# Patient Record
Sex: Female | Born: 1970 | Race: White | Hispanic: No | Marital: Single | Smoking: Current every day smoker
Health system: Southern US, Community
[De-identification: ages and names within clinical notes are randomized; demographics above are authoritative.]

## PROBLEM LIST (undated history)

## (undated) DIAGNOSIS — F419 Anxiety disorder, unspecified: Secondary | ICD-10-CM

## (undated) DIAGNOSIS — F319 Bipolar disorder, unspecified: Secondary | ICD-10-CM

## (undated) DIAGNOSIS — M1990 Unspecified osteoarthritis, unspecified site: Secondary | ICD-10-CM

## (undated) DIAGNOSIS — F1210 Cannabis abuse, uncomplicated: Secondary | ICD-10-CM

## (undated) DIAGNOSIS — Z87442 Personal history of urinary calculi: Secondary | ICD-10-CM

## (undated) DIAGNOSIS — F1010 Alcohol abuse, uncomplicated: Secondary | ICD-10-CM

## (undated) HISTORY — PX: TUBAL LIGATION: SHX77

---

## 2005-12-18 ENCOUNTER — Emergency Department (HOSPITAL_COMMUNITY): Admission: EM | Admit: 2005-12-18 | Discharge: 2005-12-19 | Payer: Self-pay | Admitting: Emergency Medicine

## 2016-12-11 ENCOUNTER — Ambulatory Visit (INDEPENDENT_AMBULATORY_CARE_PROVIDER_SITE_OTHER): Payer: Self-pay | Admitting: Family Medicine

## 2016-12-11 ENCOUNTER — Encounter: Payer: Self-pay | Admitting: Family Medicine

## 2016-12-11 DIAGNOSIS — M25562 Pain in left knee: Secondary | ICD-10-CM

## 2016-12-11 MED ORDER — METHYLPREDNISOLONE ACETATE 40 MG/ML IJ SUSP
40.0000 mg | Freq: Once | INTRAMUSCULAR | Status: AC
  Administered 2016-12-11: 40 mg via INTRA_ARTICULAR

## 2016-12-11 NOTE — Patient Instructions (Signed)
Your knee pain and swelling are due to arthritis, less likely a meniscus tear. These are the different medications you can use for this: Tylenol 500mg  1-2 tabs three times a day for pain. Aleve 1-2 tabs twice a day with food Capsaicin, aspercreme, or biofreeze topically up to four times a day may also help with pain. Cortisone injections are an option - you were given this today. If cortisone injections do not help, there are different types of shots that may help but they take longer to take effect. It's important that you continue to stay active. Straight leg raises, knee extensions 3 sets of 10 once a day (add ankle weight if these become too easy). Consider physical therapy to strengthen muscles around the joint that hurts to take pressure off of the joint itself. Shoe inserts with good arch support may be helpful. Ice 15 minutes at a time 3-4 times a day as needed to help with pain. Get the [facility] coverage. If not improving as expected over the next 1-2 weeks (and you have the cone coverage) call me and would go ahead with MRI.

## 2016-12-15 DIAGNOSIS — M25562 Pain in left knee: Secondary | ICD-10-CM | POA: Insufficient documentation

## 2016-12-15 NOTE — Progress Notes (Addendum)
PCP: [name] Patient  Subjective:   HPI: Patient is a 46 y.o. female here for left knee pain, swelling.  Patient reports she's had about 1 month of left knee pain and swelling. Started when she was going up and down a ladder, hanging sheet rock. No acute injury or trauma when this happened. Pain worse with walking, standing. Difficulty getting comfortable. Pain radiates from anterior to posterior knee. Feels medially and laterally, sharp, 10/10. Had radiographs and [facility] though unable to pull these up - told these looked ok. Taking ibuprofen 800mg  and elevating. No skin changes, numbness.  No past medical history on file.  No current outpatient prescriptions on file prior to visit.   No current facility-administered medications on file prior to visit.     No past surgical history on file.  No Known Allergies  Social History   Social History  . Marital status: Single    Spouse name: N/A  . Number of children: N/A  . Years of education: N/A   Occupational History  . Not on file.   Social History Main Topics  . Smoking status: Current Every Day Smoker  . Smokeless tobacco: Never Used  . Alcohol use Not on file  . Drug use: Unknown  . Sexual activity: Not on file   Other Topics Concern  . Not on file   Social History Narrative  . No narrative on file    No family history on file.  BP (!) 143/92   Pulse 69   Ht 5\' 4"  (1.626 m)   Wt 200 lb (90.7 kg)   BMI 34.33 kg/m   Review of Systems: See HPI above.     Objective:  Physical Exam:  Gen: NAD, comfortable in exam room  Left knee: Mild effusion.  No bruising, other deformity. TTP diffusely anteriorly. ROM 0 - 100 degrees. Negative ant/post drawers. Negative valgus/varus testing. Negative lachmanns. Pain with mcmurrays, apleys.  Negative patellar apprehension. NV intact distally.  Right knee: FROM without pain.   Assessment & Plan:  1. Left knee pain - consistent with flare of DJD,  less likely meniscus tear.  Discussed tylenol, aleve, topical medications.  Aspiration/injection given today.  Fluid clear, consistent with above diagnoses.  Home exercises reviewed.  Icing.  Paperwork given for cone coverage.  Consider MRI if not improving as expected.  After informed written consent patient was lying supine on exam table.  Left knee was prepped with alcohol swab.  Utilizing superolateral approach, 3 mL of bupivicaine was used for local anesthesia.  Then using an 18g needle on 60cc syringe, 9 mL of clear straw-colored fluid was aspirated from left knee.  Knee was then injected with 3:1 bupivicaine:depomedrol.  Patient tolerated procedure well without immediate complications.  Addendum:  MRI reviewed and discussed with patient.  She does have a medial meniscus tear with extrusion but also severe cartilage loss medially with bony edema.  Concerning she did not improve with aspiration and injection.  I doubt she will improve without surgical intervention - ? If arthroscopy is an option given underlying severe articular cartilage loss.  Will refer to ortho for further intervention.

## 2016-12-15 NOTE — Assessment & Plan Note (Signed)
consistent with flare of DJD, less likely meniscus tear.  Discussed tylenol, aleve, topical medications.  Aspiration/injection given today.  Fluid clear, consistent with above diagnoses.  Home exercises reviewed.  Icing.  Paperwork given for cone coverage.  Consider MRI if not improving as expected.  After informed written consent patient was lying supine on exam table.  Left knee was prepped with alcohol swab.  Utilizing superolateral approach, 3 mL of bupivicaine was used for local anesthesia.  Then using an 18g needle on 60cc syringe, 9 mL of clear straw-colored fluid was aspirated from left knee.  Knee was then injected with 3:1 bupivicaine:depomedrol.  Patient tolerated procedure well without immediate complications

## 2017-01-01 ENCOUNTER — Ambulatory Visit: Payer: Self-pay

## 2017-01-07 ENCOUNTER — Encounter (HOSPITAL_BASED_OUTPATIENT_CLINIC_OR_DEPARTMENT_OTHER): Payer: Self-pay

## 2017-01-07 ENCOUNTER — Emergency Department (HOSPITAL_BASED_OUTPATIENT_CLINIC_OR_DEPARTMENT_OTHER)
Admission: EM | Admit: 2017-01-07 | Discharge: 2017-01-07 | Disposition: A | Discharge status: Home / Self Care | Payer: Self-pay | Attending: Emergency Medicine | Admitting: Emergency Medicine

## 2017-01-07 ENCOUNTER — Emergency Department (HOSPITAL_BASED_OUTPATIENT_CLINIC_OR_DEPARTMENT_OTHER): Payer: Self-pay

## 2017-01-07 DIAGNOSIS — G8929 Other chronic pain: Secondary | ICD-10-CM

## 2017-01-07 DIAGNOSIS — F17200 Nicotine dependence, unspecified, uncomplicated: Secondary | ICD-10-CM | POA: Insufficient documentation

## 2017-01-07 DIAGNOSIS — M25562 Pain in left knee: Secondary | ICD-10-CM | POA: Insufficient documentation

## 2017-01-07 HISTORY — DX: Cannabis abuse, uncomplicated: F12.10

## 2017-01-07 HISTORY — DX: Anxiety disorder, unspecified: F41.9

## 2017-01-07 HISTORY — DX: Alcohol abuse, uncomplicated: F10.10

## 2017-01-07 MED ORDER — NAPROXEN 375 MG PO TABS: 375.0000 mg | ORAL_TABLET | Freq: Two times a day (BID) | ORAL | 0 refills | Status: DC

## 2017-01-07 MED ORDER — ACETAMINOPHEN 500 MG PO TABS: 500.0000 mg | ORAL_TABLET | Freq: Four times a day (QID) | ORAL | 0 refills | Status: DC | PRN

## 2017-01-07 NOTE — Discharge Instructions (Signed)
Medications: Naprosyn, Tylenol  Treatment: Take Naprosyn twice daily. Take Tylenol every 6 hours as needed for your pain in addition to Naprosyn. Use ice 3-4 times daily alternating 20 minutes on, 20 minutes off. Elevate your leg whenever you're not walking on it. Use brace to help support your knee with walking.  Follow-up: Please follow-up with Dr. Pearletha ForgeHudnall for further evaluation and treatment of your knee pain and possible MRI and she suggested before. Please return to the emergency department if you develop any fevers, pain with even small movements of your knee, excessive redness or warmth, or any other concerning symptoms.

## 2017-01-07 NOTE — ED Notes (Signed)
ED Provider at bedside. 

## 2017-01-07 NOTE — ED Provider Notes (Signed)
[facility] Provider Note   CSN: [id number] Arrival date & time: 01/07/17  1856   By signing my name below, I, Clovis Pu, attest that this documentation has been prepared under the direction and in the presence of   Gurveer Colucci,PA-C. Electronically Signed: Clovis Pu, ED Scribe. 01/07/17. 8:49 PM.   History   Chief Complaint Chief Complaint  Patient presents with  . Knee Pain    HPI Comments:  Sarah Hanson is a 46 y.o. female who presents to the Emergency Department complaining of continued left knee pain x 2-3 months. Her pain is worse with ambulation. She also reports joint swelling. Pt was seen by Dr. Norton Blizzard on 12/11/16 and had fluid aspirated as well as cortisone injection with mild relief. She has been using a knee brace, cane, and ace wrap with mild relief. She has also taken ibuprofen, applied ice and elevated her leg with mild relief. Pt denies fevers, numbness/tingling, abdominal pain or any other associated symptoms. No other complaints noted.     The history is provided by the patient. No language interpreter was used.    Past Medical History:  Diagnosis Date  . Anxiety   . ETOH abuse   . Marijuana abuse     Patient Active Problem List   Diagnosis Date Noted  . Left knee pain 12/15/2016    Past Surgical History:  Procedure Laterality Date  . CESAREAN SECTION      OB History    No data available       Home Medications    Prior to Admission medications   Medication Sig Start Date End Date Taking? Authorizing Provider  Citalopram Hydrobromide (CELEXA PO) Take by mouth.   Yes Historical Provider, MD  acetaminophen (TYLENOL) 500 MG tablet Take 1 tablet (500 mg total) by mouth every 6 (six) hours as needed. 01/07/17   Emi Holes, PA-C  naproxen (NAPROSYN) 375 MG tablet Take 1 tablet (375 mg total) by mouth 2 (two) times daily. 01/07/17   Emi Holes, PA-C    Family History No family history on file.  Social  History Social History  Substance Use Topics  . Smoking status: Current Every Day Smoker  . Smokeless tobacco: Never Used  . Alcohol use Not on file     Comment: in rehab     Allergies   Patient has no known allergies.   Review of Systems Review of Systems  Constitutional: Negative for fever.  Gastrointestinal: Negative for abdominal pain.  Musculoskeletal: Positive for joint swelling and myalgias.  Neurological: Negative for numbness.   Physical Exam Updated Vital Signs BP (!) 162/85 (BP Location: Left Arm)   Pulse 77   Temp 97.6 F (36.4 C) (Oral)   Resp 16   Ht 5\' 4"  (1.626 m)   Wt 100.2 kg   LMP 01/06/2017   SpO2 99%   BMI 37.93 kg/m   Physical Exam  Constitutional: She appears well-developed and well-nourished. No distress.  HENT:  Head: Normocephalic and atraumatic.  Mouth/Throat: Oropharynx is clear and moist. No oropharyngeal exudate.  Eyes: Conjunctivae are normal. Pupils are equal, round, and reactive to light. Right eye exhibits no discharge. Left eye exhibits no discharge. No scleral icterus.  Neck: Normal range of motion. Neck supple. No thyromegaly present.  Cardiovascular: Normal rate, regular rhythm, normal heart sounds and intact distal pulses.  Exam reveals no gallop and no friction rub.   No murmur heard. Pulmonary/Chest: Effort normal and breath sounds normal. No  stridor. No respiratory distress. She has no wheezes. She has no rales.  Abdominal: Soft. Bowel sounds are normal. She exhibits no distension. There is no tenderness. There is no rebound and no guarding.  Musculoskeletal: Normal range of motion. She exhibits edema and tenderness.  Medial joint line tenderness to left knee. Pain with McMurray test with internal rotation. No pain or laxity with varus and valgus stress. Negative anterior and posterior drawer. Mild to moderate edema. No erythema or warmth. Full passive ROM without pain. Normal sensation to extremity.  Lymphadenopathy:    She  has no cervical adenopathy.  Neurological: She is alert. Coordination normal.  Skin: Skin is warm and dry. No rash noted. She is not diaphoretic. No erythema. No pallor.  Psychiatric: She has a normal mood and affect.  Nursing note and vitals reviewed.    ED Treatments / Results  DIAGNOSTIC STUDIES:  Oxygen Saturation is 100% on RA, normal by my interpretation.    COORDINATION OF CARE:  8:45 PM Discussed treatment plan with pt at bedside and pt agreed to plan.  Labs (all labs ordered are listed, but only abnormal results are displayed) Labs Reviewed - No data to display  EKG  EKG Interpretation None       Radiology Dg Knee Complete 4 Views Left  Result Date: 01/07/2017 CLINICAL DATA:  Left knee pain and swelling for several months EXAM: LEFT KNEE - COMPLETE 4+ VIEW COMPARISON:  None. FINDINGS: Four views of the left knee submitted. No acute fracture or subluxation. There is narrowing of medial joint compartment. Mild spurring of lateral tibial plateau. No radiopaque foreign body. No joint effusion. IMPRESSION: No acute fracture or subluxation. Narrowing of medial joint compartment. Mild spurring of lateral tibial plateau. Electronically Signed   By: Natasha MeadLiviu  Pop M.D.   On: 01/07/2017 19:30    Procedures Procedures (including critical care time)  Medications Ordered in ED Medications - No data to display   Initial Impression / Assessment and Plan / ED Course  I have reviewed the triage vital signs and the nursing notes.  Pertinent labs & imaging results that were available during my care of the patient were reviewed by me and considered in my medical decision making (see chart for details).     Patient X-Ray negative for obvious fracture or dislocation. No pain with passive range of motion, no warmth or erythema. Doubt septic joint.  Pt advised to follow up Dr. Pearletha ForgeHudnall, as he planned to order MRI if patient was not improved after cortisone injection. Patient given knee  brace (pt reports her knee sleeve bought over-the-counter makes her skin break out) while in ED, conservative therapy recommended and discussed. Patient will be discharged home & is agreeable with above plan. Returns precautions discussed. Pt appears safe for discharge. Patient vitals stable throughout ED course and discharged in satisfactory condition.  Final Clinical Impressions(s) / ED Diagnoses   Final diagnoses:  Chronic pain of left knee    New Prescriptions Discharge Medication List as of 01/07/2017  8:49 PM    START taking these medications   Details  acetaminophen (TYLENOL) 500 MG tablet Take 1 tablet (500 mg total) by mouth every 6 (six) hours as needed., Starting Wed 01/07/2017, Print    naproxen (NAPROSYN) 375 MG tablet Take 1 tablet (375 mg total) by mouth 2 (two) times daily., Starting Wed 01/07/2017, Print      I personally performed the services described in this documentation, which was scribed in my presence. The recorded information  has been reviewed and is accurate.     Emi Holes, PA-C 01/08/17 1434    Arby Barrette, MD 01/13/17 [phone]

## 2017-01-07 NOTE — ED Triage Notes (Signed)
c/o left knee pain x 2-3 months-[facility] days for ETOH-NAD-limping gait with own cane

## 2017-01-08 ENCOUNTER — Telehealth: Payer: Self-pay | Admitting: General Practice

## 2017-01-08 NOTE — Telephone Encounter (Signed)
PT CALLED AND CANCELLED APPOINTMENT TO APPLY FOR GCCN CARD, SHE HAS DECIDED TO GO TO A DIFFERENT SITE FOR HER CARE

## 2017-01-12 ENCOUNTER — Ambulatory Visit: Payer: Self-pay

## 2017-02-09 NOTE — Addendum Note (Signed)
Addended by: Kathi Simpers F on: 02/09/2017 09:01 AM   Modules accepted: Orders

## 2017-02-14 ENCOUNTER — Ambulatory Visit (HOSPITAL_BASED_OUTPATIENT_CLINIC_OR_DEPARTMENT_OTHER)
Admission: RE | Admit: 2017-02-14 | Discharge: 2017-02-14 | Disposition: A | Discharge status: Home / Self Care | Payer: No Typology Code available for payment source | Source: Ambulatory Visit | Attending: Family Medicine | Admitting: Family Medicine

## 2017-02-14 DIAGNOSIS — M25562 Pain in left knee: Secondary | ICD-10-CM

## 2017-02-14 DIAGNOSIS — X58XXXA Exposure to other specified factors, initial encounter: Secondary | ICD-10-CM | POA: Insufficient documentation

## 2017-02-14 DIAGNOSIS — M25461 Effusion, right knee: Secondary | ICD-10-CM | POA: Insufficient documentation

## 2017-02-14 DIAGNOSIS — S83222A Peripheral tear of medial meniscus, current injury, left knee, initial encounter: Secondary | ICD-10-CM | POA: Insufficient documentation

## 2017-02-23 ENCOUNTER — Ambulatory Visit (INDEPENDENT_AMBULATORY_CARE_PROVIDER_SITE_OTHER): Payer: Self-pay | Admitting: Orthopaedic Surgery

## 2017-02-23 DIAGNOSIS — M1712 Unilateral primary osteoarthritis, left knee: Secondary | ICD-10-CM | POA: Insufficient documentation

## 2017-02-23 DIAGNOSIS — M25562 Pain in left knee: Secondary | ICD-10-CM

## 2017-02-23 NOTE — Progress Notes (Signed)
Office Visit Note   Patient: Sarah Hanson           Date of Birth: Feb 10, 1971           MRN: [id number] Visit Date: 02/23/2017              Requested by: No referring provider defined for this encounter. PCP: [facility]   Assessment & Plan: Visit Diagnoses:  1. Acute pain of left knee   2. Unilateral primary osteoarthritis, left knee     Plan: Given the severe cartilage loss in the medial compartment of her knee arthroscopic intervention would not be helpful. I showed her MRI and showed her a knee model and explained in detail that really only good treatment option surgically would be a partial knee replacement. I explained in detail with the risk and benefits of the surgery involves. An arthroscopic intervention wouldn't help no more than a steroid injection given the severity of her medial compartment arthritic changes. Her anterior cruciate ligament is intact and a do feel that a unicompartmental knee replacement is warranted. We had a thorough discussion of the risk and benefits of this surgery and again what the surgery involves intraoperative and postoperative. I did give her prescription for tramadol for pain. Work on getting the surgery set up. The biggest concern is that she has no financial means. She will work with the [facility] system on this. I'm happy to perform the surgery for her. We would see her back at 2 weeks postoperative but no x-rays will be needed. All questions were encouraged and answered.  Follow-Up Instructions: Return for 2 weeks post-op.   Orders:  No orders of the defined types were placed in this encounter.  No orders of the defined types were placed in this encounter.     Procedures: No procedures performed   Clinical Data: No additional findings.   Subjective: No chief complaint on file. The patient is a 46 year old female referred from Dr. Norton BlizzardShane Hudnall to evaluate her left knee. She's had some off and on pain for some  period time but in February she stepped wrong off a ladder hanging sheet rock in her knees been hurting her since then. This had several recurrent effusions. Dr. Pearletha ForgeHudnall was able to drain fluid from her knee and place a steroid injection and I gave minimal relief. An MRI has been obtained of her knee and she sent here for further evaluation and treatment of her left knee. Her pain is daily. She walks with a limp. It is 10 out of 10 pain. It is detrimentally affected her activities daily living, her mobility, and her quality of life.  HPI  Review of Systems She currently denies any headache, chest pain, shortness of breath, fever, chills, nausea, vomiting.  Objective: Vital Signs: There were no vitals taken for this visit.  Physical Exam She is alert and oriented 3 and in no acute distress. She walks with significant limp. She is wearing a knee sleeve. Ortho Exam Examination of her left knee shows a large knee joint effusion. There is medial joint line tenderness. She has slight varus malalignment is easily correctable. Her range of motion is full. Her knee feels ligamentously stable. Specialty Comments:  No specialty comments available.  Imaging: No results found. An MRI of her left knee independently reviewed by me does show significant cartilage loss on them medial compartment of her knee both on the medial femoral condyle and the medial tibial plateau. There is  reactive edema changes around this. There is a small meniscal tear of the posterior horn of the root which is a radial tear. There is a large joint effusion.  PMFS History: Patient Active Problem List   Diagnosis Date Noted  . Unilateral primary osteoarthritis, left knee 02/23/2017  . Left knee pain 12/15/2016   Past Medical History:  Diagnosis Date  . Anxiety   . ETOH abuse   . Marijuana abuse     No family history on file.  Past Surgical History:  Procedure Laterality Date  . CESAREAN SECTION     Social History    Occupational History  . Not on file.   Social History Main Topics  . Smoking status: Current Every Day Smoker  . Smokeless tobacco: Never Used  . Alcohol use Not on file     Comment: in rehab  . Drug use: Unknown     Comment: in rehab  . Sexual activity: Not on file

## 2017-02-27 ENCOUNTER — Telehealth (INDEPENDENT_AMBULATORY_CARE_PROVIDER_SITE_OTHER): Payer: Self-pay | Admitting: Orthopaedic Surgery

## 2017-02-27 NOTE — Telephone Encounter (Signed)
Ok.  Nothing else I can recommend

## 2017-02-27 NOTE — Telephone Encounter (Signed)
See below,wasn't sure if you needed this info

## 2017-02-27 NOTE — Telephone Encounter (Signed)
See below

## 2017-02-27 NOTE — Telephone Encounter (Signed)
Patient called saying that she would not like to receive the partial knee replacement. CB # [phone]

## 2017-03-02 ENCOUNTER — Telehealth (INDEPENDENT_AMBULATORY_CARE_PROVIDER_SITE_OTHER): Payer: Self-pay

## 2017-03-02 NOTE — Telephone Encounter (Signed)
I'm ok with her having a total knee instead of a partial knee replacement.

## 2017-03-02 NOTE — Telephone Encounter (Signed)
See below

## 2017-03-02 NOTE — Telephone Encounter (Signed)
Want to schedule this I guess?

## 2017-03-02 NOTE — Telephone Encounter (Signed)
Patient would like to know if she can have a left total knee replacement  instead of having a partial knee replacement.  CB# is [phone].

## 2017-03-04 NOTE — Telephone Encounter (Signed)
I called patient and left voice mail for return call to schedule. 

## 2017-03-31 ENCOUNTER — Other Ambulatory Visit (INDEPENDENT_AMBULATORY_CARE_PROVIDER_SITE_OTHER): Payer: Self-pay | Admitting: Orthopaedic Surgery

## 2017-04-01 ENCOUNTER — Other Ambulatory Visit (INDEPENDENT_AMBULATORY_CARE_PROVIDER_SITE_OTHER): Payer: Self-pay | Admitting: Physician Assistant

## 2017-04-03 ENCOUNTER — Encounter (HOSPITAL_COMMUNITY): Payer: Self-pay

## 2017-04-03 ENCOUNTER — Encounter (HOSPITAL_COMMUNITY)
Admission: RE | Admit: 2017-04-03 | Discharge: 2017-04-03 | Disposition: A | Discharge status: Home / Self Care | Payer: No Typology Code available for payment source | Source: Ambulatory Visit | Attending: Orthopaedic Surgery | Admitting: Orthopaedic Surgery

## 2017-04-03 DIAGNOSIS — M1712 Unilateral primary osteoarthritis, left knee: Secondary | ICD-10-CM | POA: Insufficient documentation

## 2017-04-03 DIAGNOSIS — Z01812 Encounter for preprocedural laboratory examination: Secondary | ICD-10-CM | POA: Insufficient documentation

## 2017-04-03 HISTORY — DX: Personal history of urinary calculi: Z87.442

## 2017-04-03 HISTORY — DX: Unspecified osteoarthritis, unspecified site: M19.90

## 2017-04-03 LAB — COMPREHENSIVE METABOLIC PANEL
ALK PHOS: 59 U/L (ref 38–126)
ALT: 37 U/L (ref 14–54)
AST: 31 U/L (ref 15–41)
Albumin: 3.9 g/dL (ref 3.5–5.0)
Anion gap: 8 (ref 5–15)
BILIRUBIN TOTAL: 0.5 mg/dL (ref 0.3–1.2)
BUN: 11 mg/dL (ref 6–20)
CALCIUM: 9.6 mg/dL (ref 8.9–10.3)
CHLORIDE: 106 mmol/L (ref 101–111)
CO2: 21 mmol/L — ABNORMAL LOW (ref 22–32)
CREATININE: 0.73 mg/dL (ref 0.44–1.00)
Glucose, Bld: 96 mg/dL (ref 65–99)
Potassium: 4 mmol/L (ref 3.5–5.1)
Sodium: 135 mmol/L (ref 135–145)
TOTAL PROTEIN: 6.9 g/dL (ref 6.5–8.1)

## 2017-04-03 LAB — CBC
HEMATOCRIT: 43.8 % (ref 36.0–46.0)
Hemoglobin: 14.5 g/dL (ref 12.0–15.0)
MCH: 31.3 pg (ref 26.0–34.0)
MCHC: 33.1 g/dL (ref 30.0–36.0)
MCV: 94.6 fL (ref 78.0–100.0)
PLATELETS: 319 10*3/uL (ref 150–400)
RBC: 4.63 MIL/uL (ref 3.87–5.11)
RDW: 13.1 % (ref 11.5–15.5)
WBC: 13.1 10*3/uL — ABNORMAL HIGH (ref 4.0–10.5)

## 2017-04-03 LAB — SURGICAL PCR SCREEN
MRSA, PCR: NEGATIVE
STAPHYLOCOCCUS AUREUS: NEGATIVE

## 2017-04-03 LAB — HCG, SERUM, QUALITATIVE: Preg, Serum: NEGATIVE

## 2017-04-03 NOTE — Pre-Procedure Instructions (Addendum)
Sarah Hanson  04/03/2017      [id number] - [place], [place] - [address] [place] [address] Phone: [phone] Fax: [phone]    Your procedure is scheduled on 04/14/17  Report to [facility] Admitting at 1100 A.M.  Call this number if you have problems the morning of surgery:  [phone]   Remember:  Do not eat food or drink liquids after midnight.  Take these medicines the morning of surgery with A SIP OF WATER   ,tylenol if needed  STOP all herbel meds, nsaids (aleve,naproxen,advil,ibuprofen) 7 days prior to surgery starting today 04/03/17 including all vitamins/supplements, aspirin   Do not wear jewelry, make-up or nail polish.  Do not wear lotions, powders, or perfumes, or deoderant.  Do not shave 48 hours prior to surgery.  Men may shave face and neck.  Do not bring valuables to the hospital.  [facility] is not responsible for any belongings or valuables.  Contacts, dentures or bridgework may not be worn into surgery.  Leave your suitcase in the car.  After surgery it may be brought to your room.  For patients admitted to the hospital, discharge time will be determined by your treatment team.  Patients discharged the day of surgery will not be allowed to drive home.   Special instructions:   Special Instructions: [facility] - Preparing for Surgery  Before surgery, you can play an important role.  Because skin is not sterile, your skin needs to be as free of germs as possible.  You can reduce the number of germs on you skin by washing with CHG (chlorahexidine gluconate) soap before surgery.  CHG is an antiseptic cleaner which kills germs and bonds with the skin to continue killing germs even after washing.  Please DO NOT use if you have an allergy to CHG or antibacterial soaps.  If your skin becomes reddened/irritated stop using the CHG and inform your nurse when you arrive at Short Stay.  Do not  shave (including legs and underarms) for at least 48 hours prior to the first CHG shower.  You may shave your face.  Please follow these instructions carefully:   1.  Shower with CHG Soap the night before surgery and the morning of Surgery.  2.  If you choose to wash your hair, wash your hair first as usual with your normal shampoo.  3.  After you shampoo, rinse your hair and body thoroughly to remove the Shampoo.  4.  Use CHG as you would any other liquid soap.  You can apply chg directly  to the skin and wash gently with scrungie or a clean washcloth.  5.  Apply the CHG Soap to your body ONLY FROM THE NECK DOWN.  Do not use on open wounds or open sores.  Avoid contact with your eyes ears, mouth and genitals (private parts).  Wash genitals (private parts)       with your normal soap.  6.  Wash thoroughly, paying special attention to the area where your surgery will be performed.  7.  Thoroughly rinse your body with warm water from the neck down.  8.  DO NOT shower/wash with your normal soap after using and rinsing off the CHG Soap.  9.  Pat yourself dry with a clean towel.            10.  Wear clean pajamas.  11.  Place clean sheets on your bed the night of your first shower and do not sleep with pets.  Day of Surgery  Do not apply any lotions/deodorants the morning of surgery.  Please wear clean clothes to the hospital/surgery center.  Please read over the  fact sheets that you were given.

## 2017-04-13 MED ORDER — CEFAZOLIN SODIUM-DEXTROSE 2-4 GM/100ML-% IV SOLN
2.0000 g | INTRAVENOUS | Status: AC
  Administered 2017-04-14: 2 g via INTRAVENOUS
  Filled 2017-04-13: qty 100

## 2017-04-13 MED ORDER — TRANEXAMIC ACID 1000 MG/10ML IV SOLN
1000.0000 mg | INTRAVENOUS | Status: DC
  Filled 2017-04-13: qty 10

## 2017-04-13 MED ORDER — TRANEXAMIC ACID 1000 MG/10ML IV SOLN
1000.0000 mg | INTRAVENOUS | Status: AC
  Administered 2017-04-14: 1000 mg via INTRAVENOUS
  Filled 2017-04-13: qty 10

## 2017-04-14 ENCOUNTER — Inpatient Hospital Stay (HOSPITAL_COMMUNITY)
Admission: RE | Admit: 2017-04-14 | Discharge: 2017-04-20 | DRG: 470 | Disposition: A | Discharge status: Home / Self Care | Payer: Self-pay | Source: Ambulatory Visit | Attending: Orthopaedic Surgery | Admitting: Orthopaedic Surgery

## 2017-04-14 ENCOUNTER — Inpatient Hospital Stay (HOSPITAL_COMMUNITY): Payer: Self-pay | Admitting: Certified Registered"

## 2017-04-14 ENCOUNTER — Encounter (HOSPITAL_COMMUNITY)
Admission: RE | Disposition: A | Discharge status: Home / Self Care | Payer: Self-pay | Source: Ambulatory Visit | Attending: Orthopaedic Surgery

## 2017-04-14 ENCOUNTER — Inpatient Hospital Stay (HOSPITAL_COMMUNITY): Payer: Self-pay

## 2017-04-14 DIAGNOSIS — F419 Anxiety disorder, unspecified: Secondary | ICD-10-CM | POA: Diagnosis present

## 2017-04-14 DIAGNOSIS — E669 Obesity, unspecified: Secondary | ICD-10-CM | POA: Diagnosis present

## 2017-04-14 DIAGNOSIS — F1010 Alcohol abuse, uncomplicated: Secondary | ICD-10-CM | POA: Diagnosis present

## 2017-04-14 DIAGNOSIS — Z6832 Body mass index (BMI) 32.0-32.9, adult: Secondary | ICD-10-CM

## 2017-04-14 DIAGNOSIS — F1210 Cannabis abuse, uncomplicated: Secondary | ICD-10-CM | POA: Diagnosis present

## 2017-04-14 DIAGNOSIS — F17210 Nicotine dependence, cigarettes, uncomplicated: Secondary | ICD-10-CM | POA: Diagnosis present

## 2017-04-14 DIAGNOSIS — Z79899 Other long term (current) drug therapy: Secondary | ICD-10-CM

## 2017-04-14 DIAGNOSIS — M1712 Unilateral primary osteoarthritis, left knee: Principal | ICD-10-CM | POA: Diagnosis present

## 2017-04-14 DIAGNOSIS — Z96652 Presence of left artificial knee joint: Secondary | ICD-10-CM

## 2017-04-14 HISTORY — PX: TOTAL KNEE ARTHROPLASTY: SHX125

## 2017-04-14 SURGERY — ARTHROPLASTY, KNEE, TOTAL
Anesthesia: Regional | Site: Knee | Laterality: Left

## 2017-04-14 MED ORDER — ONDANSETRON HCL 4 MG/2ML IJ SOLN
INTRAMUSCULAR | Status: AC
  Filled 2017-04-14: qty 2

## 2017-04-14 MED ORDER — KETOROLAC TROMETHAMINE 15 MG/ML IJ SOLN
INTRAMUSCULAR | Status: AC
  Filled 2017-04-14: qty 1

## 2017-04-14 MED ORDER — HYDROMORPHONE HCL 1 MG/ML IJ SOLN
INTRAMUSCULAR | Status: AC
  Filled 2017-04-14: qty 0.5

## 2017-04-14 MED ORDER — DEXAMETHASONE SODIUM PHOSPHATE 10 MG/ML IJ SOLN
INTRAMUSCULAR | Status: DC | PRN
  Administered 2017-04-14: 5 mg via INTRAVENOUS

## 2017-04-14 MED ORDER — PROMETHAZINE HCL 25 MG/ML IJ SOLN: 6.2500 mg | INTRAMUSCULAR | Status: DC | PRN

## 2017-04-14 MED ORDER — MIDAZOLAM HCL 2 MG/2ML IJ SOLN
INTRAMUSCULAR | Status: AC
  Filled 2017-04-14: qty 2

## 2017-04-14 MED ORDER — MENTHOL 3 MG MT LOZG: 1.0000 | LOZENGE | OROMUCOSAL | Status: DC | PRN

## 2017-04-14 MED ORDER — ACETAMINOPHEN 325 MG PO TABS
650.0000 mg | ORAL_TABLET | Freq: Four times a day (QID) | ORAL | Status: DC | PRN
  Administered 2017-04-15 – 2017-04-20 (×6): 650 mg via ORAL
  Filled 2017-04-14 (×7): qty 2

## 2017-04-14 MED ORDER — PHENYLEPHRINE 40 MCG/ML (10ML) SYRINGE FOR IV PUSH (FOR BLOOD PRESSURE SUPPORT)
PREFILLED_SYRINGE | INTRAVENOUS | Status: AC
  Filled 2017-04-14: qty 10

## 2017-04-14 MED ORDER — ONDANSETRON HCL 4 MG/2ML IJ SOLN
INTRAMUSCULAR | Status: DC | PRN
  Administered 2017-04-14: 4 mg via INTRAVENOUS

## 2017-04-14 MED ORDER — ALUM & MAG HYDROXIDE-SIMETH 200-200-20 MG/5ML PO SUSP: 30.0000 mL | ORAL | Status: DC | PRN

## 2017-04-14 MED ORDER — METOCLOPRAMIDE HCL 5 MG PO TABS: 5.0000 mg | ORAL_TABLET | Freq: Three times a day (TID) | ORAL | Status: DC | PRN

## 2017-04-14 MED ORDER — OXYCODONE HCL 5 MG/5ML PO SOLN: 5.0000 mg | Freq: Once | ORAL | Status: DC | PRN

## 2017-04-14 MED ORDER — HYDROMORPHONE HCL 1 MG/ML IJ SOLN
0.2500 mg | INTRAMUSCULAR | Status: DC | PRN
  Administered 2017-04-14 (×3): 0.5 mg via INTRAVENOUS

## 2017-04-14 MED ORDER — HYDROMORPHONE HCL 1 MG/ML IJ SOLN
INTRAMUSCULAR | Status: AC
  Administered 2017-04-14: 0.5 mg via INTRAVENOUS
  Filled 2017-04-14: qty 0.5

## 2017-04-14 MED ORDER — SODIUM CHLORIDE 0.9 % IR SOLN
Status: DC | PRN
  Administered 2017-04-14: 3000 mL

## 2017-04-14 MED ORDER — METHOCARBAMOL 500 MG PO TABS
ORAL_TABLET | ORAL | Status: AC
  Administered 2017-04-14: 500 mg via ORAL
  Filled 2017-04-14: qty 1

## 2017-04-14 MED ORDER — ASPIRIN EC 325 MG PO TBEC
325.0000 mg | DELAYED_RELEASE_TABLET | Freq: Two times a day (BID) | ORAL | Status: DC
  Administered 2017-04-14 – 2017-04-20 (×12): 325 mg via ORAL
  Filled 2017-04-14 (×12): qty 1

## 2017-04-14 MED ORDER — CEFAZOLIN SODIUM-DEXTROSE 1-4 GM/50ML-% IV SOLN
1.0000 g | Freq: Four times a day (QID) | INTRAVENOUS | Status: AC
  Administered 2017-04-14 – 2017-04-15 (×2): 1 g via INTRAVENOUS
  Filled 2017-04-14 (×2): qty 50

## 2017-04-14 MED ORDER — FENTANYL CITRATE (PF) 100 MCG/2ML IJ SOLN
INTRAMUSCULAR | Status: AC
  Filled 2017-04-14: qty 2

## 2017-04-14 MED ORDER — ONDANSETRON HCL 4 MG PO TABS
4.0000 mg | ORAL_TABLET | Freq: Four times a day (QID) | ORAL | Status: DC | PRN
  Administered 2017-04-17: 4 mg via ORAL
  Filled 2017-04-14: qty 1

## 2017-04-14 MED ORDER — PHENYLEPHRINE 40 MCG/ML (10ML) SYRINGE FOR IV PUSH (FOR BLOOD PRESSURE SUPPORT)
PREFILLED_SYRINGE | INTRAVENOUS | Status: DC | PRN
  Administered 2017-04-14: 80 ug via INTRAVENOUS
  Administered 2017-04-14 (×2): 40 ug via INTRAVENOUS
  Administered 2017-04-14 (×2): 80 ug via INTRAVENOUS
  Administered 2017-04-14 (×2): 40 ug via INTRAVENOUS

## 2017-04-14 MED ORDER — ONDANSETRON HCL 4 MG/2ML IJ SOLN: 4.0000 mg | Freq: Four times a day (QID) | INTRAMUSCULAR | Status: DC | PRN

## 2017-04-14 MED ORDER — OXYCODONE HCL 5 MG PO TABS
ORAL_TABLET | ORAL | Status: AC
  Administered 2017-04-14: 10 mg via ORAL
  Filled 2017-04-14: qty 2

## 2017-04-14 MED ORDER — HYDROMORPHONE HCL 1 MG/ML IJ SOLN
1.0000 mg | INTRAMUSCULAR | Status: DC | PRN
  Administered 2017-04-14 – 2017-04-19 (×32): 1 mg via INTRAVENOUS
  Filled 2017-04-14 (×33): qty 1

## 2017-04-14 MED ORDER — 0.9 % SODIUM CHLORIDE (POUR BTL) OPTIME
TOPICAL | Status: DC | PRN
  Administered 2017-04-14: 1000 mL

## 2017-04-14 MED ORDER — OXYCODONE HCL 5 MG PO TABS
5.0000 mg | ORAL_TABLET | ORAL | Status: DC | PRN
  Administered 2017-04-14 – 2017-04-15 (×5): 10 mg via ORAL
  Filled 2017-04-14 (×4): qty 2

## 2017-04-14 MED ORDER — DOCUSATE SODIUM 100 MG PO CAPS
100.0000 mg | ORAL_CAPSULE | Freq: Two times a day (BID) | ORAL | Status: DC
  Administered 2017-04-14 – 2017-04-20 (×12): 100 mg via ORAL
  Filled 2017-04-14 (×13): qty 1

## 2017-04-14 MED ORDER — FENTANYL CITRATE (PF) 250 MCG/5ML IJ SOLN
INTRAMUSCULAR | Status: AC
  Filled 2017-04-14: qty 5

## 2017-04-14 MED ORDER — POLYETHYLENE GLYCOL 3350 17 G PO PACK
17.0000 g | PACK | Freq: Every day | ORAL | Status: DC | PRN
  Administered 2017-04-17: 17 g via ORAL
  Filled 2017-04-14: qty 1

## 2017-04-14 MED ORDER — METHOCARBAMOL 1000 MG/10ML IJ SOLN: 500.0000 mg | Freq: Four times a day (QID) | INTRAVENOUS | Status: DC | PRN

## 2017-04-14 MED ORDER — PHENOL 1.4 % MT LIQD: 1.0000 | OROMUCOSAL | Status: DC | PRN

## 2017-04-14 MED ORDER — KETOROLAC TROMETHAMINE 15 MG/ML IJ SOLN
7.5000 mg | Freq: Four times a day (QID) | INTRAMUSCULAR | Status: AC
  Administered 2017-04-14 – 2017-04-15 (×4): 7.5 mg via INTRAVENOUS
  Filled 2017-04-14 (×3): qty 1

## 2017-04-14 MED ORDER — PROPOFOL 500 MG/50ML IV EMUL
INTRAVENOUS | Status: DC | PRN
  Administered 2017-04-14: 75 ug/kg/min via INTRAVENOUS

## 2017-04-14 MED ORDER — LACTATED RINGERS IV SOLN
INTRAVENOUS | Status: DC
  Administered 2017-04-14: 12:00:00 via INTRAVENOUS

## 2017-04-14 MED ORDER — ZOLPIDEM TARTRATE 5 MG PO TABS: 5.0000 mg | ORAL_TABLET | Freq: Every evening | ORAL | Status: DC | PRN

## 2017-04-14 MED ORDER — ACETAMINOPHEN 650 MG RE SUPP: 650.0000 mg | Freq: Four times a day (QID) | RECTAL | Status: DC | PRN

## 2017-04-14 MED ORDER — MIDAZOLAM HCL 2 MG/2ML IJ SOLN
INTRAMUSCULAR | Status: DC | PRN
  Administered 2017-04-14: 2 mg via INTRAVENOUS

## 2017-04-14 MED ORDER — METOCLOPRAMIDE HCL 5 MG/ML IJ SOLN: 5.0000 mg | Freq: Three times a day (TID) | INTRAMUSCULAR | Status: DC | PRN

## 2017-04-14 MED ORDER — METHOCARBAMOL 500 MG PO TABS
500.0000 mg | ORAL_TABLET | Freq: Four times a day (QID) | ORAL | Status: DC | PRN
  Administered 2017-04-14 – 2017-04-20 (×21): 500 mg via ORAL
  Filled 2017-04-14 (×22): qty 1

## 2017-04-14 MED ORDER — BUPIVACAINE IN DEXTROSE 0.75-8.25 % IT SOLN
INTRATHECAL | Status: DC | PRN
  Administered 2017-04-14: 1.6 mL via INTRATHECAL

## 2017-04-14 MED ORDER — OXYCODONE HCL 5 MG PO TABS
5.0000 mg | ORAL_TABLET | Freq: Once | ORAL | Status: DC | PRN
Start: 2017-04-14 — End: 2017-04-14

## 2017-04-14 MED ORDER — DEXAMETHASONE SODIUM PHOSPHATE 10 MG/ML IJ SOLN
INTRAMUSCULAR | Status: AC
  Filled 2017-04-14: qty 1

## 2017-04-14 MED ORDER — CHLORHEXIDINE GLUCONATE 4 % EX LIQD: 60.0000 mL | Freq: Once | CUTANEOUS | Status: DC

## 2017-04-14 MED ORDER — SODIUM CHLORIDE 0.9 % IV SOLN
INTRAVENOUS | Status: DC
  Administered 2017-04-14: 16:00:00 via INTRAVENOUS

## 2017-04-14 MED ORDER — ROPIVACAINE HCL 5 MG/ML IJ SOLN
INTRAMUSCULAR | Status: DC | PRN
  Administered 2017-04-14: 30 mL via PERINEURAL

## 2017-04-14 MED ORDER — MIDAZOLAM HCL 2 MG/2ML IJ SOLN
2.0000 mg | Freq: Once | INTRAMUSCULAR | Status: AC
Start: 2017-04-14 — End: 2017-04-14
  Administered 2017-04-14: 2 mg via INTRAVENOUS

## 2017-04-14 MED ORDER — FENTANYL CITRATE (PF) 250 MCG/5ML IJ SOLN
INTRAMUSCULAR | Status: DC | PRN
  Administered 2017-04-14: 100 ug via INTRAVENOUS

## 2017-04-14 SURGICAL SUPPLY — 69 items
BANDAGE ACE 4X5 VEL STRL LF (GAUZE/BANDAGES/DRESSINGS) ×3 IMPLANT
BANDAGE ACE 6X5 VEL STRL LF (GAUZE/BANDAGES/DRESSINGS) ×4 IMPLANT
BANDAGE ESMARK 6X9 LF (GAUZE/BANDAGES/DRESSINGS) ×1 IMPLANT
BLADE SAG 18X100X1.27 (BLADE) ×3 IMPLANT
BNDG CMPR 9X6 STRL LF SNTH (GAUZE/BANDAGES/DRESSINGS) ×1
BNDG ESMARK 6X9 LF (GAUZE/BANDAGES/DRESSINGS) ×3
BOWL SMART MIX CTS (DISPOSABLE) ×3 IMPLANT
CAPT KNEE TOTAL 3 ×2 IMPLANT
CEMENT BONE SIMPLEX SPEEDSET (Cement) ×6 IMPLANT
CLOSURE STERI-STRIP 1/2X4 (GAUZE/BANDAGES/DRESSINGS) ×1
CLOSURE WOUND 1/2 X4 (GAUZE/BANDAGES/DRESSINGS)
CLSR STERI-STRIP ANTIMIC 1/2X4 (GAUZE/BANDAGES/DRESSINGS) ×1 IMPLANT
COVER SURGICAL LIGHT HANDLE (MISCELLANEOUS) ×3 IMPLANT
CUFF TOURNIQUET SINGLE 34IN LL (TOURNIQUET CUFF) ×3 IMPLANT
DRAPE EXTREMITY T 121X128X90 (DRAPE) ×3 IMPLANT
DRAPE HALF SHEET 40X57 (DRAPES) ×3 IMPLANT
DRAPE U-SHAPE 47X51 STRL (DRAPES) ×3 IMPLANT
DRSG PAD ABDOMINAL 8X10 ST (GAUZE/BANDAGES/DRESSINGS) ×3 IMPLANT
DURAPREP 26ML APPLICATOR (WOUND CARE) ×3 IMPLANT
ELECT CAUTERY BLADE 6.4 (BLADE) ×3 IMPLANT
ELECT REM PT RETURN 9FT ADLT (ELECTROSURGICAL) ×3
ELECTRODE REM PT RTRN 9FT ADLT (ELECTROSURGICAL) ×1 IMPLANT
FACESHIELD WRAPAROUND (MASK) ×6 IMPLANT
FACESHIELD WRAPAROUND OR TEAM (MASK) ×2 IMPLANT
GAUZE SPONGE 4X4 12PLY STRL (GAUZE/BANDAGES/DRESSINGS) ×3 IMPLANT
GAUZE SPONGE 4X4 12PLY STRL LF (GAUZE/BANDAGES/DRESSINGS) ×3 IMPLANT
GAUZE XEROFORM 1X8 LF (GAUZE/BANDAGES/DRESSINGS) ×3 IMPLANT
GLOVE BIOGEL PI IND STRL 6.5 (GLOVE) IMPLANT
GLOVE BIOGEL PI IND STRL 8 (GLOVE) ×2 IMPLANT
GLOVE BIOGEL PI INDICATOR 6.5 (GLOVE) ×2
GLOVE BIOGEL PI INDICATOR 8 (GLOVE) ×4
GLOVE ORTHO TXT STRL SZ7.5 (GLOVE) ×3 IMPLANT
GLOVE SURG ORTHO 8.0 STRL STRW (GLOVE) ×6 IMPLANT
GLOVE SURG SS PI 6.5 STRL IVOR (GLOVE) ×6 IMPLANT
GOWN STRL REUS W/ TWL LRG LVL3 (GOWN DISPOSABLE) IMPLANT
GOWN STRL REUS W/ TWL XL LVL3 (GOWN DISPOSABLE) ×2 IMPLANT
GOWN STRL REUS W/TWL LRG LVL3 (GOWN DISPOSABLE)
GOWN STRL REUS W/TWL XL LVL3 (GOWN DISPOSABLE) ×6
HANDPIECE INTERPULSE COAX TIP (DISPOSABLE) ×3
IMMOBILIZER KNEE 22 (SOFTGOODS) ×3 IMPLANT
IMMOBILIZER KNEE 22 UNIV (SOFTGOODS) ×3 IMPLANT
KIT BASIN OR (CUSTOM PROCEDURE TRAY) ×3 IMPLANT
KIT ROOM TURNOVER OR (KITS) ×3 IMPLANT
MANIFOLD NEPTUNE II (INSTRUMENTS) ×3 IMPLANT
NDL SAFETY ECLIPSE 18X1.5 (NEEDLE) IMPLANT
NEEDLE HYPO 18GX1.5 SHARP (NEEDLE)
NS IRRIG 1000ML POUR BTL (IV SOLUTION) ×3 IMPLANT
PACK TOTAL JOINT (CUSTOM PROCEDURE TRAY) ×3 IMPLANT
PAD ABD 8X10 STRL (GAUZE/BANDAGES/DRESSINGS) ×3 IMPLANT
PAD ARMBOARD 7.5X6 YLW CONV (MISCELLANEOUS) ×3 IMPLANT
PADDING CAST COTTON 6X4 STRL (CAST SUPPLIES) ×3 IMPLANT
SET HNDPC FAN SPRY TIP SCT (DISPOSABLE) ×1 IMPLANT
SET PAD KNEE POSITIONER (MISCELLANEOUS) ×3 IMPLANT
STAPLER VISISTAT 35W (STAPLE) IMPLANT
STRIP CLOSURE SKIN 1/2X4 (GAUZE/BANDAGES/DRESSINGS) IMPLANT
SUCTION FRAZIER HANDLE 10FR (MISCELLANEOUS) ×2
SUCTION TUBE FRAZIER 10FR DISP (MISCELLANEOUS) ×1 IMPLANT
SUT MNCRL AB 4-0 PS2 18 (SUTURE) ×2 IMPLANT
SUT VIC AB 0 CT1 27 (SUTURE) ×3
SUT VIC AB 0 CT1 27XBRD ANBCTR (SUTURE) ×1 IMPLANT
SUT VIC AB 1 CT1 27 (SUTURE) ×6
SUT VIC AB 1 CT1 27XBRD ANBCTR (SUTURE) ×2 IMPLANT
SUT VIC AB 2-0 CT1 27 (SUTURE) ×6
SUT VIC AB 2-0 CT1 TAPERPNT 27 (SUTURE) ×2 IMPLANT
SYR 50ML LL SCALE MARK (SYRINGE) IMPLANT
TOWEL OR 17X24 6PK STRL BLUE (TOWEL DISPOSABLE) ×3 IMPLANT
TOWEL OR 17X26 10 PK STRL BLUE (TOWEL DISPOSABLE) ×3 IMPLANT
TRAY CATH 16FR W/PLASTIC CATH (SET/KITS/TRAYS/PACK) ×3 IMPLANT
WRAP KNEE MAXI GEL POST OP (GAUZE/BANDAGES/DRESSINGS) ×3 IMPLANT

## 2017-04-14 NOTE — H&P (Signed)
TOTAL KNEE ADMISSION H&P  Patient is being admitted for left total knee arthroplasty.  Subjective:  Chief Complaint:left knee pain.  HPI: Sarah Hanson, 46 y.o. female, has a history of pain and functional disability in the left knee due to arthritis and has failed non-surgical conservative treatments for greater than 12 weeks to includeNSAID's and/or analgesics, corticosteriod injections, flexibility and strengthening excercises and activity modification.  Onset of symptoms was abrupt, starting 1 years ago with rapidlly worsening course since that time. The patient noted no past surgery on the left knee(s).  Patient currently rates pain in the left knee(s) at 10 out of 10 with activity. Patient has night pain, worsening of pain with activity and weight bearing, pain that interferes with activities of daily living, pain with passive range of motion, crepitus and joint swelling.  Patient has evidence of subchondral sclerosis, periarticular osteophytes and joint space narrowing by imaging studies. There is no active infection.  Patient Active Problem List   Diagnosis Date Noted  . Unilateral primary osteoarthritis, left knee 02/23/2017  . Left knee pain 12/15/2016   Past Medical History:  Diagnosis Date  . Anxiety    no med at this time--pt quit due to $  . Arthritis   . ETOH abuse   . History of kidney stones   . Marijuana abuse     Past Surgical History:  Procedure Laterality Date  . CESAREAN SECTION    . CESAREAN SECTION     x2  . TUBAL LIGATION      Prescriptions Prior to Admission  Medication Sig Dispense Refill Last Dose  . bismuth subsalicylate (PEPTO BISMOL) 262 MG/15ML suspension Take 30 mLs by mouth every 4 (four) hours as needed for indigestion.   Past Week at Unknown time  . Menthol, Topical Analgesic, (BIOFREEZE EX) Apply 1 application topically as needed (for pain).   Past Month at Unknown time  . naproxen sodium (ANAPROX) 220 MG tablet Take 220-440 mg by mouth 2 (two)  times daily as needed (for pain).     Marland Kitchen acetaminophen (TYLENOL) 500 MG tablet Take 1 tablet (500 mg total) by mouth every 6 (six) hours as needed. (Patient not taking: Reported on 04/03/2017) 30 tablet 0 Not Taking at Unknown time  . naproxen (NAPROSYN) 375 MG tablet Take 1 tablet (375 mg total) by mouth 2 (two) times daily. (Patient not taking: Reported on 04/03/2017) 20 tablet 0 Completed Course at Unknown time   Allergies  Allergen Reactions  . No Known Allergies     Social History  Substance Use Topics  . Smoking status: Current Every Day Smoker    Packs/day: 1.00    Years: 30.00  . Smokeless tobacco: Never Used  . Alcohol use Yes     Comment: 2 weeks- beer    No family history on file.   Review of Systems  Musculoskeletal: Positive for joint pain.  All other systems reviewed and are negative.   Objective:  Physical Exam  Constitutional: She is oriented to person, place, and time. She appears well-developed and well-nourished.  HENT:  Head: Normocephalic and atraumatic.  Eyes: EOM are normal. Pupils are equal, round, and reactive to light.  Neck: Normal range of motion. Neck supple.  Cardiovascular: Normal rate and regular rhythm.   Respiratory: Effort normal and breath sounds normal.  GI: Soft. Bowel sounds are normal.  Musculoskeletal:       Left knee: She exhibits decreased range of motion, effusion and abnormal alignment. Tenderness found. Medial joint  line tenderness noted.  Neurological: She is alert and oriented to person, place, and time.  Skin: Skin is warm and dry.  Psychiatric: She has a normal mood and affect.    Vital signs in last 24 hours: Temp:  [98 F (36.7 C)] 98 F (36.7 C) (06/26 1025) Pulse Rate:  [68-78] 73 (06/26 1150) Resp:  [18-22] 22 (06/26 1150) BP: (111-188)/(84-102) 111/87 (06/26 1150) SpO2:  [98 %-100 %] 100 % (06/26 1150) Weight:  [204 lb (92.5 kg)] 204 lb (92.5 kg) (06/26 1025)  Labs:   Estimated body mass index is 32.93 kg/m  as calculated from the following:   Height as of 04/03/17: 5\' 6"  (1.676 m).   Weight as of this encounter: 204 lb (92.5 kg).   Imaging Review Plain radiographs demonstrate severe degenerative joint disease of the left knee(s). The overall alignment ismild varus. The bone quality appears to be excellent for age and reported activity level.  Assessment/Plan:  End stage arthritis, left knee   The patient history, physical examination, clinical judgment of the provider and imaging studies are consistent with end stage degenerative joint disease of the left knee(s) and total knee arthroplasty is deemed medically necessary. The treatment options including medical management, injection therapy arthroscopy and arthroplasty were discussed at length. The risks and benefits of total knee arthroplasty were presented and reviewed. The risks due to aseptic loosening, infection, stiffness, patella tracking problems, thromboembolic complications and other imponderables were discussed. The patient acknowledged the explanation, agreed to proceed with the plan and consent was signed. Patient is being admitted for inpatient treatment for surgery, pain control, PT, OT, prophylactic antibiotics, VTE prophylaxis, progressive ambulation and ADL's and discharge planning. The patient is planning to be discharged to skilled nursing facility

## 2017-04-14 NOTE — Anesthesia Procedure Notes (Signed)
Anesthesia Regional Block: Adductor canal block   Pre-Anesthetic Checklist: ,, timeout performed, Correct Patient, Correct Site, Correct Laterality, Correct Procedure,, site marked, risks and benefits discussed, Surgical consent,  Pre-op evaluation,  At surgeon's request and post-op pain management  Laterality: Left  Prep: chloraprep       Needles:  Injection technique: Single-shot  Needle Type: Echogenic Stimulator Needle     Needle Length: 9cm  Needle Gauge: 21     Additional Needles:   Procedures: ultrasound guided,,,,,,,,  Narrative:  Start time: 04/14/2017 11:30 AM End time: 04/14/2017 11:40 AM Injection made incrementally with aspirations every 5 mL.  Performed by: Personally  Anesthesiologist: Karna ChristmasELLENDER, Marien Manship P  Additional Notes: Functioning IV was confirmed and monitors were applied.  A 90mm 21ga Arrow echogenic stimulator needle was used. Sterile prep,hand hygiene and sterile gloves were used.  Negative aspiration and negative test dose prior to incremental administration of local anesthetic. The patient tolerated the procedure well.

## 2017-04-14 NOTE — Progress Notes (Signed)
Orthopedic Tech Progress Note Patient Details:  Sarah Hanson 05-11-1971 [id number]  CPM Left Knee CPM Left Knee: On Left Knee Flexion (Degrees): 60 Left Knee Extension (Degrees): 0  Ortho Devices Ortho Device/Splint Interventions: Ordered, Application, Adjustment   Jennye MoccasinHughes, Kelissa Merlin Craig 04/14/2017, 3:59 PM

## 2017-04-14 NOTE — Transfer of Care (Signed)
Immediate Anesthesia Transfer of Care Note  Patient: Sarah Hanson  Procedure(s) Performed: Procedure(s): LEFT TOTAL KNEE ARTHROPLASTY (Left)  Patient Location: PACU  Anesthesia Type:Spinal  Level of Consciousness: awake, alert  and oriented  Airway & Oxygen Therapy: Patient Spontanous Breathing  Post-op Assessment: Report given to RN and Post -op Vital signs reviewed and stable  Post vital signs: Reviewed and stable  Last Vitals:  Vitals:   04/14/17 1145 04/14/17 1150  BP: (!) 168/95 111/87  Pulse: 74 73  Resp: 20 (!) 22  Temp:      Last Pain:  Vitals:   04/14/17 1103  TempSrc:   PainSc: 7          Complications: No apparent anesthesia complications

## 2017-04-14 NOTE — Anesthesia Preprocedure Evaluation (Addendum)
Anesthesia Evaluation  Patient identified by MRN, date of birth, ID band Patient awake    Reviewed: Allergy & Precautions, NPO status , Patient's Chart, lab work & pertinent test results  Airway Mallampati: II  TM Distance: >3 FB Neck ROM: Full    Dental no notable dental hx.    Pulmonary Current Smoker,    Pulmonary exam normal breath sounds clear to auscultation       Cardiovascular negative cardio ROS Normal cardiovascular exam Rhythm:Regular Rate:Normal     Neuro/Psych Anxiety negative neurological ROS     GI/Hepatic negative GI ROS, (+)     substance abuse  ,   Endo/Other  negative endocrine ROS  Renal/GU negative Renal ROS  negative genitourinary   Musculoskeletal  (+) Arthritis , Osteoarthritis,    Abdominal   Peds negative pediatric ROS (+)  Hematology negative hematology ROS (+)   Anesthesia Other Findings Obese   Reproductive/Obstetrics negative OB ROS                             Anesthesia Physical Anesthesia Plan  ASA: II  Anesthesia Plan: Spinal and Regional   Post-op Pain Management:  Regional for Post-op pain   Induction: Intravenous  PONV Risk Score and Plan: 1 and Ondansetron, Dexamethasone, Propofol and Midazolam  Airway Management Planned:   Additional Equipment:   Intra-op Plan:   Post-operative Plan:   Informed Consent: I have reviewed the patients History and Physical, chart, labs and discussed the procedure including the risks, benefits and alternatives for the proposed anesthesia with the patient or authorized representative who has indicated his/her understanding and acceptance.   Dental advisory given  Plan Discussed with: CRNA  Anesthesia Plan Comments: (Recent lab results reviewed)        Anesthesia Quick Evaluation

## 2017-04-14 NOTE — Brief Op Note (Signed)
04/14/2017  2:12 PM  PATIENT:  Sarah Hanson  46 y.o. female  PRE-OPERATIVE DIAGNOSIS:  left knee osteoarthritis  POST-OPERATIVE DIAGNOSIS:  left knee osteoarthritis  PROCEDURE:  Procedure(s): LEFT TOTAL KNEE ARTHROPLASTY (Left)  SURGEON:  Surgeon(s) and Role:    Kathryne Hitch* Nusayba Cadenas Y, MD - Primary  PHYSICIAN ASSISTANT: Rexene EdisonGil Clark, PA-C  ANESTHESIA:   regional and spinal  EBL:  100 cc  COUNTS:  YES  TOURNIQUET:   Total Tourniquet Time Documented: Thigh (Left) - 48 minutes Total: Thigh (Left) - 48 minutes   DICTATION: .Other Dictation: Dictation Number [id number]  PLAN OF CARE: Admit to inpatient   PATIENT DISPOSITION:  PACU - hemodynamically stable.   Delay start of Pharmacological VTE agent (>24hrs) due to surgical blood loss or risk of bleeding: no

## 2017-04-15 ENCOUNTER — Encounter (HOSPITAL_COMMUNITY): Payer: Self-pay | Admitting: Orthopaedic Surgery

## 2017-04-15 LAB — BASIC METABOLIC PANEL
Anion gap: 9 (ref 5–15)
BUN: 6 mg/dL (ref 6–20)
CALCIUM: 8.9 mg/dL (ref 8.9–10.3)
CO2: 24 mmol/L (ref 22–32)
CREATININE: 0.67 mg/dL (ref 0.44–1.00)
Chloride: 102 mmol/L (ref 101–111)
GLUCOSE: 138 mg/dL — AB (ref 65–99)
Potassium: 4.1 mmol/L (ref 3.5–5.1)
Sodium: 135 mmol/L (ref 135–145)

## 2017-04-15 LAB — CBC
HCT: 37.9 % (ref 36.0–46.0)
Hemoglobin: 12.8 g/dL (ref 12.0–15.0)
MCH: 31.6 pg (ref 26.0–34.0)
MCHC: 33.8 g/dL (ref 30.0–36.0)
MCV: 93.6 fL (ref 78.0–100.0)
PLATELETS: 331 10*3/uL (ref 150–400)
RBC: 4.05 MIL/uL (ref 3.87–5.11)
RDW: 12.7 % (ref 11.5–15.5)
WBC: 19.8 10*3/uL — ABNORMAL HIGH (ref 4.0–10.5)

## 2017-04-15 MED ORDER — OXYCODONE HCL 5 MG PO TABS
5.0000 mg | ORAL_TABLET | ORAL | Status: DC | PRN
  Administered 2017-04-15 – 2017-04-18 (×21): 15 mg via ORAL
  Administered 2017-04-18: 10 mg via ORAL
  Administered 2017-04-18 – 2017-04-19 (×8): 15 mg via ORAL
  Administered 2017-04-19: 10 mg via ORAL
  Administered 2017-04-19 – 2017-04-20 (×6): 15 mg via ORAL
  Filled 2017-04-15 (×27): qty 3
  Filled 2017-04-15: qty 2
  Filled 2017-04-15 (×9): qty 3

## 2017-04-15 NOTE — Clinical Social Work Note (Signed)
Clinical Social Work Assessment  Patient Details  Name: Sarah Hanson MRN: [id number] Date of Birth: Mar 30, 1971  Date of referral:  04/15/17               Reason for consult:  Facility Placement                Permission sought to share information with:  Facility Art therapist granted to share information::  Yes, Verbal Permission Granted  Name::        Agency::  SNF  Relationship::     Contact Information:     Housing/Transportation Living arrangements for the past 2 months:  Apartment Source of Information:  Patient Patient Interpreter Needed:  None Criminal Activity/Legal Involvement Pertinent to Current Situation/Hospitalization:  No - Comment as needed Significant Relationships:  Friend Lives with:  Friends Do you feel safe going back to the place where you live?  No Need for family participation in patient care:  No (Coment)  Care giving concerns:  Patient resides with a friend in [place]. She indicated that the home is not safe for her to go back to in her conditions as the friend has pets and she will not be able to move around or get assistance with impairment. Patient has no insurance and indicated she was working up to 4 months when she lost her job due to her current complaints. As a result of the job loss, she also lost her apartment. At this time, she is unsafe to go home and will need SNF assistance. Patient has no income at this time and has no family support. Patient has walked about 35 feet with PT. CSW will f/u with LOG for SNF.   FL2 complete. Passr obtained. Will defe offers until LOG is discussed.  Social Worker assessment / plan:  CSW met with patient at bedside to discuss clinical teams recommendation at DC. CSW explained her role.  CSW discussed barriers due to no medical insurance. CSW will continue to follow up.  Employment status:  Unemployed Forensic scientist:  Self Pay (Medicaid Pending) PT Recommendations:  [place] / Referral to community resources:  [place]  Patient/Family's Response to care:  Patient has no issues with care. Patient desires a SNF placement and is adamant that she cannot be cared for at home.  Patient/Family's Understanding of and Emotional Response to Diagnosis, Current Treatment, and Prognosis:  Patient has good understanding of diagnosis, current treatment and prognosis. Pt feeling that SNF will provide some support with impairment as she stated she "has a long road ahead for recovery." No issues or concerns at this time.  Emotional Assessment Appearance:  Appears stated age Attitude/Demeanor/Rapport:   (Cooperative) Affect (typically observed):  Accepting, Appropriate Orientation:  Oriented to Self, Oriented to Place, Oriented to  Time, Oriented to Situation Alcohol / Substance use:  Not Applicable Psych involvement (Current and /or in the community):  No (Comment)  Discharge Needs  Concerns to be addressed:  Care Coordination Readmission within the last 30 days:  No Current discharge risk:  Physical Impairment, Dependent with Mobility, Inadequate Financial Supports Barriers to Discharge:  No Barriers Identified   Normajean Baxter, LCSW 04/15/2017, 4:22 PM

## 2017-04-15 NOTE — Evaluation (Signed)
Occupational Therapy Evaluation Patient Details Name: Sarah Hanson MRN: [id number] DOB: Feb 23, 1971 Today's Date: 04/15/2017    History of Present Illness Patient is a 46 y/o female s/p L TKA. PMHx: Anxiety, Arthritis, ETOH abuse.   Clinical Impression   Pt reports she was independent with ADL PTA. Currently pt min guard for functional mobility, min assist for UB ADL in sitting, and max assist for LB ADL. Pt presenting with pain which is limiting her mobility and participation in ADL. Recommending SNF for follow up to maximize independence and safety with ADL and functional mobility prior to return home. Pt would benefit from continued skilled OT to address established goals.    Follow Up Recommendations  SNF;Supervision/Assistance - 24 hour    Equipment Recommendations  Other (comment) (TBD at next venue)    Recommendations for Other Services       Precautions / Restrictions Precautions Precautions: Fall;Knee Restrictions Weight Bearing Restrictions: Yes LLE Weight Bearing: Weight bearing as tolerated      Mobility Bed Mobility Overal bed mobility: Needs Assistance Bed Mobility: Supine to Sit;Sit to Supine     Supine to sit: Min assist Sit to supine: Min assist   General bed mobility comments: Assist for LLE, increased time and pain wtih bed mobility  Transfers Overall transfer level: Needs assistance Equipment used: Rolling walker (2 wheeled) Transfers: Sit to/from Stand Sit to Stand: Min guard         General transfer comment: cues for hand placement, min guard for safety. increased time and pain with sit to stand    Balance Overall balance assessment: Needs assistance Sitting-balance support: Feet supported;No upper extremity supported Sitting balance-Leahy Scale: Good     Standing balance support: Bilateral upper extremity supported Standing balance-Leahy Scale: Poor Standing balance comment: RW for support                           ADL  either performed or assessed with clinical judgement   ADL Overall ADL's : Needs assistance/impaired Eating/Feeding: Set up;Sitting   Grooming: Min guard;Sitting   Upper Body Bathing: Minimal assistance;Sitting   Lower Body Bathing: Maximal assistance;Sit to/from stand   Upper Body Dressing : Min guard;Sitting   Lower Body Dressing: Maximal assistance;Sit to/from stand   Toilet Transfer: Min guard;Ambulation;RW Toilet Transfer Details (indicate cue type and reason): Simulated by sit to stand from EOB with functional mobility         Functional mobility during ADLs: Min guard;Rolling walker General ADL Comments: Limited by pain this session; educated on ice for edema and pain control     Vision         Perception     Praxis      Pertinent Vitals/Pain Pain Assessment: 0-10 Pain Score: 10-Worst pain ever Faces Pain Scale: Hurts whole lot Pain Location: L knee Pain Descriptors / Indicators: Aching;Crying;Sore Pain Intervention(s): Monitored during session;Limited activity within patient's tolerance;Ice applied     Hand Dominance     Extremity/Trunk Assessment Upper Extremity Assessment Upper Extremity Assessment: Overall WFL for tasks assessed   Lower Extremity Assessment Lower Extremity Assessment: Defer to PT evaluation    Cervical / Trunk Assessment Cervical / Trunk Assessment: Normal   Communication Communication Communication: No difficulties   Cognition Arousal/Alertness: Awake/alert Behavior During Therapy: Anxious Overall Cognitive Status: Within Functional Limits for tasks assessed  General Comments       Exercises   Shoulder Instructions      Home Living Family/patient expects to be discharged to:: Skilled nursing facility Living Arrangements: Non-relatives/Friends                               Additional Comments: was staying a friend's home, but concerned about safety of  that environment and not have help.        Prior Functioning/Environment Level of Independence: Independent                 OT Problem List: Decreased strength;Decreased activity tolerance;Impaired balance (sitting and/or standing);Decreased knowledge of use of DME or AE;Decreased knowledge of precautions;Obesity;Pain;Increased edema      OT Treatment/Interventions: Self-care/ADL training;Therapeutic exercise;Energy conservation;DME and/or AE instruction;Therapeutic activities;Patient/family education;Balance training    OT Goals(Current goals can be found in the care plan section) Acute Rehab OT Goals Patient Stated Goal: To get rehab prior to d/c home OT Goal Formulation: With patient Time For Goal Achievement: 04/29/17 Potential to Achieve Goals: Good ADL Goals Pt Will Perform Lower Body Bathing: with supervision;sit to/from stand Pt Will Perform Lower Body Dressing: with supervision;sit to/from stand Pt Will Transfer to Toilet: with supervision;ambulating;bedside commode (over toilet) Pt Will Perform Toileting - Clothing Manipulation and hygiene: with supervision;sit to/from stand  OT Frequency: Min 2X/week   Barriers to D/C: Decreased caregiver support          Co-evaluation              AM-PAC PT "6 Clicks" Daily Activity     Outcome Measure Help from another person eating meals?: None Help from another person taking care of personal grooming?: A Little Help from another person toileting, which includes using toliet, bedpan, or urinal?: A Little Help from another person bathing (including washing, rinsing, drying)?: A Lot Help from another person to put on and taking off regular upper body clothing?: A Little Help from another person to put on and taking off regular lower body clothing?: A Lot 6 Click Score: 17   End of Session Equipment Utilized During Treatment: Rolling walker CPM Left Knee CPM Left Knee: Off Nurse Communication: Mobility status;Patient  requests pain meds  Activity Tolerance: Patient limited by pain Patient left: in bed;with call bell/phone within reach  OT Visit Diagnosis: Other abnormalities of gait and mobility (R26.89);Pain Pain - Right/Left: Left Pain - part of body: Knee                Time: 1540-1556 OT Time Calculation (min): 16 min Charges:  OT General Charges $OT Visit: 1 Procedure OT Evaluation $OT Eval Moderate Complexity: 1 Procedure G-Codes:     Tamantha Saline A. Brett Albinooffey, M.S., OTR/L Pager: [phone]  Gaye AlkenBailey A Linde Wilensky 04/15/2017, 4:32 PM

## 2017-04-15 NOTE — Progress Notes (Signed)
Subjective: 1 Day Post-Op Procedure(s) (LRB): LEFT TOTAL KNEE ARTHROPLASTY (Left) Patient reports pain as moderate.    Objective: Vital signs in last 24 hours: Temp:  [97.5 F (36.4 C)-98.6 F (37 C)] 98.6 F (37 C) (06/27 0500) Pulse Rate:  [60-78] 76 (06/27 0500) Resp:  [16-22] 18 (06/27 0500) BP: (95-188)/(63-102) 128/79 (06/27 0500) SpO2:  [96 %-100 %] 96 % (06/27 0500) Weight:  [204 lb (92.5 kg)] 204 lb (92.5 kg) (06/26 1025)  Intake/Output from previous day: 06/26 0701 - 06/27 0700 In: 1340 [P.O.:240; I.V.:1000] Out: 600 [Urine:400; Blood:200] Intake/Output this shift: No intake/output data recorded.   Recent Labs  04/15/17 0618  HGB 12.8    Recent Labs  04/15/17 0618  WBC 19.8*  RBC 4.05  HCT 37.9  PLT 331   No results for input(s): NA, K, CL, CO2, BUN, CREATININE, GLUCOSE, CALCIUM in the last 72 hours. No results for input(s): LABPT, INR in the last 72 hours.  Sensation intact distally Intact pulses distally Dorsiflexion/Plantar flexion intact Incision: dressing C/D/I Compartment soft  Assessment/Plan: 1 Day Post-Op Procedure(s) (LRB): LEFT TOTAL KNEE ARTHROPLASTY (Left) Up with therapy Discharge to SNF by Friday due to no family support and difficult living situation.  Kathryne HitchChristopher Y Radames Mejorado 04/15/2017, 7:07 AM

## 2017-04-15 NOTE — Progress Notes (Signed)
Pt calling for pain med while thie RN was assisting other patients. Requested that the secretary tell pt I will be with her as soon as possible. Pt became very aggitated and argumentative that we liked for our pts to be in pain and that this RN must have a problem with coming in her room. Attempted to explain to pt that I was with other patients and I could not respond immediately as she desired. Reasonable timeframe for assistance is 15-20 min.  RN told pt  That another RN would be provided for her.   Patient also told the nurse tech that she had to wait a long time for her care and that she must have issues with coming in her room.   Charge nurse apprised of this situation.

## 2017-04-15 NOTE — Progress Notes (Signed)
Orthopedic Tech Progress Note Patient Details:  Sarah Mullmy R Gerardo 10/01/1971 [id number]  Patient ID: Sarah Hanson, female   DOB: 03/31/1971, 46 y.o.   MRN: [id number]   Nikki DomCrawford, Ladamien Rammel 04/15/2017, 2:02 PM Placed pt's lle on cpm @0 -30 degrees @1405 ; RN notified

## 2017-04-15 NOTE — Progress Notes (Signed)
PT Cancellation Note  Patient Details Name: Sarah Hanson MRN: [id number] DOB: 1970/10/23   Cancelled Treatment:    Reason Eval/Treat Not Completed: Fatigue/lethargy limiting ability to participate; attempted second visit.  First attempt pt trying to get comfortable and awaiting meds at 4pm. Second attempt just s/p OT visit.  Will attempt tomorrow.    Elray McgregorCynthia Avien Taha 04/15/2017, 5:10 PM  Sheran Lawlessyndi Ayva [place], [place] CarolinaPT [phone] 04/15/2017

## 2017-04-15 NOTE — Evaluation (Signed)
Physical Therapy Evaluation Patient Details Name: Sarah Hanson MRN: [id number] DOB: 1971/09/12 Today's Date: 04/15/2017   History of Present Illness  Patient is a 46 y/o female s/p L TKA  Clinical Impression  Patient presents with decreased independence with mobility due to pain and limited tolerance of acitivity.  She will benefit from skilled PT in the acute setting to allow return to independent following SNF rehab stay.     Follow Up Recommendations SNF    Equipment Recommendations  Rolling walker with 5" wheels    Recommendations for Other Services       Precautions / Restrictions Restrictions LLE Weight Bearing: Weight bearing as tolerated      Mobility  Bed Mobility Overal bed mobility: Needs Assistance Bed Mobility: Supine to Sit     Supine to sit: Min assist     General bed mobility comments: for R LE, increased time  Transfers Overall transfer level: Needs assistance Equipment used: Rolling walker (2 wheeled) Transfers: Sit to/from Stand Sit to Stand: Min assist         General transfer comment: cues for technique, increased time and painful  Ambulation/Gait Ambulation/Gait assistance: Min assist Ambulation Distance (Feet): 35 Feet (& 15') Assistive device: Rolling walker (2 wheeled) Gait Pattern/deviations: Step-through pattern;Step-to pattern;Trunk flexed;Decreased stride length;Antalgic     General Gait Details: cues for sequence, walker safety and with encouragement due to pain  Stairs            Wheelchair Mobility    Modified Rankin (Stroke Patients Only)       Balance Overall balance assessment: Needs assistance   Sitting balance-Leahy Scale: Good     Standing balance support: During functional activity;No upper extremity supported Standing balance-Leahy Scale: Fair Standing balance comment: for handwashing after toileting S for safety                             Pertinent Vitals/Pain Pain Assessment:  Faces Faces Pain Scale: Hurts whole lot Pain Location: L knee with exercises Pain Descriptors / Indicators: Crying;Sore Pain Intervention(s): Monitored during session;Repositioned;Ice applied    Home Living Family/patient expects to be discharged to:: Skilled nursing facility Living Arrangements: Non-relatives/Friends               Additional Comments: was staying a friend's home, but concerned about safety of that environment and not have help.      Prior Function Level of Independence: Independent               Hand Dominance        Extremity/Trunk Assessment   Upper Extremity Assessment Upper Extremity Assessment: Overall WFL for tasks assessed    Lower Extremity Assessment Lower Extremity Assessment: LLE deficits/detail LLE Deficits / Details: knee extension approx -10, flexion AAROM approx 40       Communication   Communication: No difficulties  Cognition Arousal/Alertness: Awake/alert Behavior During Therapy: Anxious Overall Cognitive Status: Within Functional Limits for tasks assessed                                        General Comments      Exercises Total Joint Exercises Ankle Circles/Pumps: AROM;10 reps;Both;Supine Quad Sets: AROM;Both;10 reps;Supine Short Arc Quad: AROM;Left;10 reps;Supine Heel Slides: AAROM;Left;10 reps;Supine Straight Leg Raises: AAROM;Left;10 reps;Supine   Assessment/Plan    PT Assessment Patient needs continued PT services  PT Problem List Decreased strength;Decreased range of motion;Decreased activity tolerance;Decreased balance;Pain;Decreased knowledge of use of DME;Decreased mobility;Decreased knowledge of precautions       PT Treatment Interventions DME instruction;Gait training;Balance training;Functional mobility training;Therapeutic exercise;Patient/family education;Therapeutic activities    PT Goals (Current goals can be found in the Care Plan section)  Acute Rehab PT Goals Patient  Stated Goal: To get rehab prior to d/c home PT Goal Formulation: With patient Time For Goal Achievement: 04/18/17 Potential to Achieve Goals: Good    Frequency 7X/week   Barriers to discharge Decreased caregiver support      Co-evaluation               AM-PAC PT "6 Clicks" Daily Activity  Outcome Measure Difficulty turning over in bed (including adjusting bedclothes, sheets and blankets)?: A Little Difficulty moving from lying on back to sitting on the side of the bed? : Total Difficulty sitting down on and standing up from a chair with arms (e.g., wheelchair, bedside commode, etc,.)?: Total Help needed moving to and from a bed to chair (including a wheelchair)?: A Little Help needed walking in hospital room?: A Little Help needed climbing 3-5 steps with a railing? : A Little 6 Click Score: 14    End of Session Equipment Utilized During Treatment: Gait belt Activity Tolerance: Patient limited by pain Patient left: in chair;with call bell/phone within reach   PT Visit Diagnosis: Difficulty in walking, not elsewhere classified (R26.2);Pain Pain - Right/Left: Left Pain - part of body: Knee    Time: 1610-9604 PT Time Calculation (min) (ACUTE ONLY): 32 min   Charges:   PT Evaluation $PT Eval Moderate Complexity: 1 Procedure PT Treatments $Gait Training: 8-22 mins   PT G CodesSheran Lawless, [place] [phone] 04/15/2017   Elray Mcgregor 04/15/2017, 1:51 PM

## 2017-04-15 NOTE — Anesthesia Procedure Notes (Addendum)
Spinal  Patient location during procedure: OR Start time: 04/15/2017 12:40 PM End time: 04/15/2017 12:50 PM Staffing Anesthesiologist: Karna ChristmasELLENDER, RYAN P Performed: anesthesiologist  Preanesthetic Checklist Completed: patient identified, surgical consent, pre-op evaluation, timeout performed, IV checked, risks and benefits discussed and monitors and equipment checked Spinal Block Patient position: sitting Prep: DuraPrep Patient monitoring: cardiac monitor, continuous pulse ox and blood pressure Approach: midline Location: L4-5 Injection technique: single-shot Needle Needle type: Pencan  Needle gauge: 24 G Needle length: 9 cm Assessment Sensory level: T10 Additional Notes Functioning IV was confirmed and monitors were applied. Sterile prep and drape, including hand hygiene and sterile gloves were used. The patient was positioned and the spine was prepped. The skin was anesthetized with lidocaine.  Free flow of clear CSF was obtained prior to injecting local anesthetic into the CSF.  The spinal needle aspirated freely following injection.  The needle was carefully withdrawn.  The patient tolerated the procedure well.

## 2017-04-15 NOTE — Plan of Care (Signed)
Problem: Safety: Goal: Ability to remain free from injury will improve Outcome: Progressing No falls during this admission. Call bell within reach. Bed in low and locked position. Patient alert and oriented. Clean and clear environment maintained. 3/4 siderails in place. Nonskid footwear being utilized. Patient verbalized understanding of safety instruction.  Problem: Pain Management: Goal: Pain level will decrease with appropriate interventions Outcome: Not Progressing Pain level not being managed at this time. Patient receiving multiple scheduled and PRN medications to control pain. Patient crying in bed. Vital signs are stable. Ice packs have been applied to operative limb.

## 2017-04-15 NOTE — Op Note (Signed)
NAME:  Sarah Hanson, Sarah Hanson                     ACCOUNT NO.:  MEDICAL RECORD NO.:  [id number]  LOCATION:                                 FACILITY:  PHYSICIAN:  Vanita PandaChristopher Y. Magnus IvanBlackman, M.D.DATE OF BIRTH:  DATE OF PROCEDURE:  04/14/2017 DATE OF DISCHARGE:                              OPERATIVE REPORT   PREOPERATIVE DIAGNOSES:  Primary osteoarthritis and degenerative joint disease, left knee.  POSTOPERATIVE DIAGNOSES:  Primary osteoarthritis and degenerative joint disease, left knee.  PROCEDURE:  Left total knee arthroplasty.  IMPLANTS:  Stryker Triathlon knee with size 3 femur, size 3 tibial tray, 9 mm fix-bearing polyethylene insert, size 28 patellar button.  SURGEON:  Vanita PandaChristopher Y. Magnus IvanBlackman, M.D.  ASSISTANT:  Richardean CanalGilbert Clark, PA-C.  ANESTHESIA: 1. Left regional adductor canal block. 2. Spinal.  BLOOD LOSS:  Less than 100 mL.  ANTIBIOTICS:  2 g of IV Ancef.  COMPLICATIONS:  None.  INDICATIONS:  Sarah Hanson is only a 46 year old female with debilitating arthritis involving her left knee.  She has had multiple recurrent effusions of left knee and has been drained on several occasions.  X-ray showed mild varus malalignment of medial joint space narrowing.  She had an MRI that showed a full-thickness cartilage loss extensively in the medial femoral condyle, medial tibial plateau with reactive edematous changes in the medial femoral condyle and medial tibial plateau as well.  Her pain is daily and is detrimental, affected her activities of daily living, her quality of life, her mobility.  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 Given her young age, I still recommended a partial knee arthroplasty.  She felt that the partial knee arthroplasty had too much risk of the need to be revised in the future. She did wish to proceed with a total knee arthroplasty instead.  We had a long and thorough discussion about this as well as the risks and benefits of surgery including the  risk of acute blood loss anemia, nerve and vessel injury, fracture, infection, and DVT.  She understands our goals are decreased pain, improved mobility, and overall improved quality of life.  PROCEDURE DESCRIPTION:  After informed consent was obtained, appropriate left knee was marked.  She was brought to the operating room after an adductor canal block was obtained in the holding area as well. In the operating room, spinal anesthesia was obtained.  She laid in supine position.  A nonsterile tourniquet was placed around her upper left thigh, and her left leg was prepped and draped from the thigh down to the toes with DuraPrep and sterile drapes.  Time-out was called.  She was identified as correct patient and correct left knee.  I then used the Esmarch to wrap out the leg, and the tourniquet was inflated to 300 mm of pressure.  I then made a direct midline incision over the patella and carried this proximally and distally.  I dissected down the knee joint and carried out a medial parapatellar arthrotomy finding a large joint effusion.  We cleaned osteophytes around the knee and then had the knee in a flexed position, removing the remnants of ACL, PCL, medial, and lateral meniscus.  We then used extramedullary  cutting guide on the tibia, based off taking 9 mm off the high side, correcting for varus and valgus and a neutral slope.  We made this cut without difficulty.  We then went to the femur and set our distal femoral cutting guide, based off an 8 mm distal femoral cut setting the rotation for 5 degrees, externally rotated for the left knee.  We made a distal femoral cut without difficulty, and with the knee in full extension, we tried a 9-mm extension block and this gave [place] good extension which was full.  We then went back to the femur and put our femoral sizing guide based off the epicondylar axis and made our choice for a size 3 femur.  We put our 4- in-1 cutting block for the size  3 femur and made our anterior and posterior cuts followed by our chamfer cuts.  We then made our femoral box cut.  Attention was then turned back to the tibia.  We chose a size 3 tibial base plate, tray which covered the tibial plateau.  We set the rotation based off the tibial tubercle and the femur.  We made a keel cut without difficulty.  With the size 3 trial tibia followed by the size 3 femur for the left knee, we placed a 9-mm fix-bearing polyethylene insert, and we were pleased with our flexion and extension gaps, range of motion, and stability.  We then made our patellar cut and drilled 3 holes for a size 28 central patellar button. We then removed all trial instrumentation, irrigated the knee with normal saline solution.  We cauterized the posterior elements of the knee as well as removed any other remnants of debris and tissue.  We mixed our cement and cemented the real Stryker Triathlon tibial tray size 3, followed by the size 3 femur. We removed cement debris from the knee and placed the real 9-mm fix-bearing polyethylene insert.  We then cemented our patellar button.  Once the cement had hardened, hemostasis was obtained with electrocautery. Once we let the tourniquet down, we put the knee through several efforts of range of motion.  We were pleased with stability.  We then irrigated the knee again with normal saline solution and closed the arthrotomy with interrupted #1 Vicryl suture followed by 0 Vicryl in the deep tissue, 2-0 Vicryl in the subcutaneous tissue, 4-0 Monocryl subcuticular stitch, and Steri-Strips on the skin.  Aquacel dressing was applied.  An in and out catheter was performed for bladder, and then, she was taken to the recovery room in stable condition.  All final counts were correct.  There were no complications noted.  Of note, Richardean Canal, PA-C, assisted in the entire case.  His assistance was crucial for facilitating all aspects of this  case.     Vanita Panda. Magnus Ivan, M.D.     CYB/MEDQ  D:  04/14/2017  T:  04/14/2017  Job:  [id number]

## 2017-04-15 NOTE — Anesthesia Postprocedure Evaluation (Signed)
Anesthesia Post Note  Patient: Bryttney R Rayon  Procedure(s) Performed: Procedure(s) (LRB): LEFT TOTAL KNEE ARTHROPLASTY (Left)     Patient location during evaluation: PACU Anesthesia Type: Regional and Spinal Level of consciousness: oriented and awake and alert Pain management: pain level controlled Vital Signs Assessment: post-procedure vital signs reviewed and stable Respiratory status: spontaneous breathing, respiratory function stable and patient connected to nasal cannula oxygen Cardiovascular status: blood pressure returned to baseline and stable Postop Assessment: no headache and no backache Anesthetic complications: no    Last Vitals:  Vitals:   04/15/17 0500 04/15/17 1539  BP: 128/79 126/76  Pulse: 76 76  Resp: 18 18  Temp: 37 C 36.9 C    Last Pain:  Vitals:   04/15/17 1539  TempSrc: Oral  PainSc: 10-Worst pain ever                 Ryan P Ellender

## 2017-04-16 ENCOUNTER — Encounter (HOSPITAL_COMMUNITY): Payer: Self-pay | Admitting: General Practice

## 2017-04-16 NOTE — Care Management Note (Addendum)
Case Management Note  Patient Details  Name: Sarah Hanson MRN: [id number] Date of Birth: [birth year]  Subjective/Objective:      46 yr old female s/p left total knee arthroplasty.              Action/Plan: [facility] will be [facility] by [facility]. Patient will be staying with friend at discharge. DME has been ordered.    Expected Discharge Date:    04/16/17              Expected Discharge Plan:  Home w Home Health Services  In-House Referral:  NA  Discharge planning Services  CM Consult  Post Acute Care Choice:  Durable Medical Equipment, Home Health Choice offered to:  NA  DME Arranged:  3-N-1, Walker rolling DME Agency:  [facility]  [facility]  [facility]  [facility]  Status of Service:  Completed, signed off  If discussed at Long Length of Stay Meetings, dates discussed:    Additional Comments:  Durenda GuthrieBrady, Alexandru Moorer Naomi, RN 04/16/2017, 4:06 PM

## 2017-04-16 NOTE — Plan of Care (Signed)
Problem: Safety: Goal: Ability to remain free from injury will improve Patient's belongings and call bell are within reach. Patient voices understanding to call for assistance.

## 2017-04-16 NOTE — Progress Notes (Signed)
Physical Therapy Treatment Patient Details Name: Sarah Hanson MRN: [id number] DOB: 12-29-1970 Today's Date: 04/16/2017    History of Present Illness Patient is a 46 y/o female s/p L TKA. PMHx: Anxiety, Arthritis, ETOH abuse.    PT Comments    Patient progressing slowly due to frustration with situation and continued complaints of inability to d/c to friend's home or be picked up in his truck.  Feel she can make more progress with time, but assured her she will likely d/c tomorrow, though she reports not feeling ready.  Will continue skilled PT to progress toward goals (stair goal added) prior to d/c.   Follow Up Recommendations  Home health PT [facility](HH aide)     Equipment Recommendations  Rolling walker with 5" wheels;3in1 (PT)    Recommendations for Other Services       Precautions / Restrictions Precautions Precautions: Fall;Knee Restrictions LLE Weight Bearing: Weight bearing as tolerated    Mobility  Bed Mobility Overal bed mobility: Needs Assistance Bed Mobility: Supine to Sit;Sit to Supine     Supine to sit: Min assist Sit to supine: Min assist   General bed mobility comments: able to slowly scoot her L leg out to EOB refusing help, then to supine assist to get leg into bed  Transfers Overall transfer level: Needs assistance Equipment used: Rolling walker (2 wheeled) Transfers: Sit to/from Stand Sit to Stand: Supervision         General transfer comment: assist for UE support  Ambulation/Gait Ambulation/Gait assistance: Min guard Ambulation Distance (Feet): 115 Feet Assistive device: Rolling walker (2 wheeled) Gait Pattern/deviations: Step-to pattern;Step-through pattern;Antalgic;Trunk flexed     General Gait Details: again encouragement to walk farther this session, but only agreed to same distance   Stairs Stairs:  (refused stairs this pm)          Wheelchair Mobility    Modified Rankin (Stroke Patients Only)       Balance Overall  balance assessment: Needs assistance   Sitting balance-Leahy Scale: Good     Standing balance support: Bilateral upper extremity supported Standing balance-Leahy Scale: Poor Standing balance comment: RW for support                            Cognition Arousal/Alertness: Awake/alert Behavior During Therapy: Anxious Overall Cognitive Status: Within Functional Limits for tasks assessed                                        Exercises Total Joint Exercises Ankle Circles/Pumps: AROM;Both;10 reps;Supine Quad Sets: AROM;10 reps;Both;Supine Short Arc Quad: AROM;Left;10 reps;Supine Heel Slides: AROM;Left;10 reps;Seated Straight Leg Raises: AAROM;Left;10 reps;Supine Knee Flexion: AAROM;Left;10 reps;Supine Goniometric ROM: 10 extension 65 flexion    General Comments General comments (skin integrity, edema, etc.): attempted to discuss car tranfers to truck her friend has for transport home; pt not taking instruction stating she could not believe we would allow her to go home in a truck; educated about ambulance transport for a fee, she stated she has [employer] [facility] [employer] and educated may not be covered.      Pertinent Vitals/Pain Faces Pain Scale: Hurts even more Pain Location: L knee Pain Descriptors / Indicators: Aching;Sore Pain Intervention(s): Monitored during session;Repositioned;Ice applied    Home Living Family/patient expects to be discharged to:: Private residence Living Arrangements: Non-relatives/Friends  Prior Function            PT Goals (current goals can now be found in the care plan section) Progress towards PT goals: Progressing toward goals (slow and self limited)    Frequency    7X/week      PT Plan Current plan remains appropriate    Co-evaluation              AM-PAC PT "6 Clicks" Daily Activity  Outcome Measure  Difficulty turning over in bed (including adjusting bedclothes, sheets  and blankets)?: A Little Difficulty moving from lying on back to sitting on the side of the bed? : Total Difficulty sitting down on and standing up from a chair with arms (e.g., wheelchair, bedside commode, etc,.)?: Total Help needed moving to and from a bed to chair (including a wheelchair)?: A Little Help needed walking in hospital room?: A Little Help needed climbing 3-5 steps with a railing? : A Little 6 Click Score: 14    End of Session Equipment Utilized During Treatment: Gait belt Activity Tolerance: Patient limited by pain Patient left: in bed;with call bell/phone within reach   PT Visit Diagnosis: Difficulty in walking, not elsewhere classified (R26.2);Pain Pain - Right/Left: Right Pain - part of body: Knee     Time: 1610-9604 PT Time Calculation (min) (ACUTE ONLY): 27 min  Charges:  $Gait Training: 8-22 mins $Therapeutic Exercise: 8-22 mins                    G CodesSheran Lawless, [place] [phone] 04/16/2017    Elray Mcgregor 04/16/2017, 5:19 PM

## 2017-04-16 NOTE — Social Work (Signed)
CSW met with clinical supervisor to discuss obtaining LOG for patient to go to SNF. Discussed patient at length. LOG has been denied. Patient will need to go home with home services. CSW will f/u with RNCM to assist with any home options for patient. Patient resides with friend in [place] and can return to that home.

## 2017-04-16 NOTE — Progress Notes (Signed)
Orthopedic Tech Progress Note Patient Details:  Sarah Mullmy R [name] January 16, 1971 [id number]  Patient ID: Sarah Hanson, female   DOB: January 16, 1971, 46 y.o.   MRN: [id number]   Sarah Hanson, [name] 04/16/2017, 1:48 PM Placed pt's lle on cpm @0 -30 degrees @1350 ; will increase as pt tolerates; RN notified

## 2017-04-16 NOTE — Progress Notes (Signed)
Subjective: 2 Days Post-Op Procedure(s) (LRB): LEFT TOTAL KNEE ARTHROPLASTY (Left) Patient reports pain as moderate.    Objective: Vital signs in last 24 hours: Temp:  [98.1 F (36.7 C)-98.5 F (36.9 C)] 98.1 F (36.7 C) (06/27 2023) Pulse Rate:  [76-81] 81 (06/27 2023) Resp:  [18] 18 (06/27 2023) BP: (126-173)/(76-88) 173/88 (06/27 2023) SpO2:  [96 %-98 %] 96 % (06/27 2023)  Intake/Output from previous day: 06/27 0701 - 06/28 0700 In: 960 [P.O.:960] Out: -  Intake/Output this shift: Total I/O In: 360 [P.O.:360] Out: -    Recent Labs  04/15/17 0618  HGB 12.8    Recent Labs  04/15/17 0618  WBC 19.8*  RBC 4.05  HCT 37.9  PLT 331    Recent Labs  04/15/17 0618  NA 135  K 4.1  CL 102  CO2 24  BUN 6  CREATININE 0.67  GLUCOSE 138*  CALCIUM 8.9   No results for input(s): LABPT, INR in the last 72 hours.  Sensation intact distally Intact pulses distally Dorsiflexion/Plantar flexion intact Incision: scant drainage No cellulitis present Compartment soft  Assessment/Plan: 2 Days Post-Op Procedure(s) (LRB): LEFT TOTAL KNEE ARTHROPLASTY (Left) Up with therapy Plan for discharge tomorrow Discharge to SNF  Sarah HitchChristopher Y Hommer Hanson 04/16/2017, 9:44 AM

## 2017-04-16 NOTE — Progress Notes (Signed)
Physical Therapy Treatment Patient Details Name: Sarah Hanson MRN: [id number] DOB: 10/23/1970 Today's Date: 04/16/2017    History of Present Illness Patient is a 46 y/o female s/p L TKA. PMHx: Anxiety, Arthritis, ETOH abuse.    PT Comments    Patient progressing with distance with ambulation with encouragement.  Reports found out would not qualify for inpatient SNF stay.  Discussed home needs and began practicing OOB on her own.  She will continue to benefit from skilled PT in the acute setting prior to d/c home with follow up HHPT.   Follow Up Recommendations  Home health PT     Equipment Recommendations  Rolling walker with 5" wheels;3in1 (PT)    Recommendations for Other Services       Precautions / Restrictions Precautions Precautions: Fall;Knee Restrictions LLE Weight Bearing: Weight bearing as tolerated    Mobility  Bed Mobility Overal bed mobility: Needs Assistance       Supine to sit: Supervision Sit to supine: Min guard   General bed mobility comments: able to slowly scoot her L leg out to EOB refusing help, then to supine assist to get leg into bed  Transfers Overall transfer level: Needs assistance Equipment used: Rolling walker (2 wheeled) Transfers: Sit to/from Stand Sit to Stand: Supervision         General transfer comment: assist for UE support  Ambulation/Gait Ambulation/Gait assistance: Min assist Ambulation Distance (Feet): 115 Feet Assistive device: Rolling walker (2 wheeled) Gait Pattern/deviations: Step-to pattern;Trunk flexed;Antalgic     General Gait Details: stops to stand and rest about 3 times during ambulation; encouragement for distance   Stairs            Wheelchair Mobility    Modified Rankin (Stroke Patients Only)       Balance Overall balance assessment: Needs assistance   Sitting balance-Leahy Scale: Good     Standing balance support: Bilateral upper extremity supported Standing balance-Leahy Scale:  Poor Standing balance comment: RW for support                            Cognition Arousal/Alertness: Awake/alert Behavior During Therapy: Anxious Overall Cognitive Status: Within Functional Limits for tasks assessed                                        Exercises Total Joint Exercises Ankle Circles/Pumps: AROM;Both;10 reps;Supine Quad Sets: AROM;10 reps;Both;Supine Short Arc Quad: AROM;Left;10 reps;Supine Heel Slides: AAROM;Left;10 reps;Supine Straight Leg Raises: AAROM;Left;10 reps;Supine Knee Flexion: AAROM;Left;10 reps;Supine Goniometric ROM: 10 extension 65 flexion    General Comments        Pertinent Vitals/Pain Faces Pain Scale: Hurts whole lot Pain Location: L knee Pain Descriptors / Indicators: Aching;Crying;Sore Pain Intervention(s): Monitored during session;Repositioned;Ice applied    Home Living                      Prior Function            PT Goals (current goals can now be found in the care plan section) Progress towards PT goals: Progressing toward goals    Frequency    7X/week      PT Plan Discharge plan needs to be updated    Co-evaluation              AM-PAC PT "6 Clicks" Daily Activity  Outcome Measure  Difficulty turning over in bed (including adjusting bedclothes, sheets and blankets)?: A Little Difficulty moving from lying on back to sitting on the side of the bed? : Total Difficulty sitting down on and standing up from a chair with arms (e.g., wheelchair, bedside commode, etc,.)?: Total Help needed moving to and from a bed to chair (including a wheelchair)?: A Little Help needed walking in hospital room?: A Little Help needed climbing 3-5 steps with a railing? : A Little 6 Click Score: 14    End of Session Equipment Utilized During Treatment: Gait belt Activity Tolerance: Patient limited by pain Patient left: in bed;with call bell/phone within reach   PT Visit Diagnosis: Difficulty  in walking, not elsewhere classified (R26.2);Pain Pain - Right/Left: Right Pain - part of body: Knee     Time: 0940-1010 PT Time Calculation (min) (ACUTE ONLY): 30 min  Charges:  $Gait Training: 8-22 mins $Therapeutic Exercise: 8-22 mins                    G CodesSheran Lawless:       Cyndi Itza [place], [place] CarolinaPT [phone] 04/16/2017    Elray Mcgregorynthia Hortensia Duffin 04/16/2017, 1:38 PM

## 2017-04-17 MED ORDER — OXYCODONE-ACETAMINOPHEN 5-325 MG PO TABS: 1.0000 | ORAL_TABLET | ORAL | 0 refills | Status: DC | PRN

## 2017-04-17 MED ORDER — TIZANIDINE HCL 4 MG PO TABS: 4.0000 mg | ORAL_TABLET | Freq: Three times a day (TID) | ORAL | 0 refills | Status: DC | PRN

## 2017-04-17 MED ORDER — ASPIRIN 325 MG PO TBEC: 325.0000 mg | DELAYED_RELEASE_TABLET | Freq: Two times a day (BID) | ORAL | 0 refills | Status: DC

## 2017-04-17 NOTE — Progress Notes (Signed)
Physical Therapy Treatment Patient Details Name: Sarah Hanson MRN: [id number] DOB: 10/22/1970 Today's Date: 04/17/2017    History of Present Illness Patient is a 46 y/o female s/p L TKA. PMHx: Anxiety, Arthritis, ETOH abuse.    PT Comments    Pt anxious during session and upset about how she is going to fit in her friend's truck to d/c. Attempted to discuss some options, but pt not fully receptive.  Pt able to manage 1 stair with RW and reviewed safe mobility.  Recommend HHPT and HH aide.   Follow Up Recommendations  Home health PT;Other (comment) [facility] aide)     Equipment Recommendations  Rolling walker with 5" wheels;3in1 (PT) (in room)    Recommendations for Other Services       Precautions / Restrictions Precautions Precautions: Fall;Knee Precaution Booklet Issued: Yes (comment) Precaution Comments: reviewed Restrictions LLE Weight Bearing: Weight bearing as tolerated    Mobility  Bed Mobility Overal bed mobility: Needs Assistance Bed Mobility: Supine to Sit;Sit to Supine     Supine to sit: Min assist;Min guard Sit to supine: Min guard;Min assist   General bed mobility comments: multiple supine <> sit during session and pt with varying degrees of required A.  At times used opposite leg to A with increased time, other times, needed MIN A  Transfers Overall transfer level: Needs assistance Equipment used: Rolling walker (2 wheeled) Transfers: Sit to/from Stand Sit to Stand: Supervision         General transfer comment: poor placement of UE and did not listen to PT's cues with any transfer.  Pt did perform SPT bed > BSC with S and back to bed I'ly  Ambulation/Gait Ambulation/Gait assistance: Min guard;Supervision Ambulation Distance (Feet): 45 Feet Assistive device: Rolling walker (2 wheeled) Gait Pattern/deviations: Step-to pattern;Step-through pattern;Antalgic;Trunk flexed     General Gait Details: Amb only in room due to not feeling well and having done  therex and step training   Stairs Stairs: Yes   Stair Management: Forwards;Backwards;With walker Number of Stairs: 1 General stair comments: Pt adamantly refused to try going up backwards with RW, so instructed in how to go up forwards.  Pt instructed in proper sequencing and pt got very frustrated with this.  Reviewed and starred home handout with instructions on proper sequence  Wheelchair Mobility    Modified Rankin (Stroke Patients Only)       Balance                                            Cognition Arousal/Alertness: Awake/alert Behavior During Therapy: Anxious Overall Cognitive Status: Within Functional Limits for tasks assessed                                        Exercises Total Joint Exercises Ankle Circles/Pumps: AROM;Both;10 reps Quad Sets: AROM;Left;10 reps Heel Slides: AAROM;Left;10 reps Goniometric ROM: grossly 45    General Comments        Pertinent Vitals/Pain Pain Assessment: 0-10 Pain Score: 8  Pain Location: L knee Pain Descriptors / Indicators: Aching;Sore Pain Intervention(s): Limited activity within patient's tolerance;Premedicated before session;Monitored during session;Ice applied    Home Living                      Prior Function  PT Goals (current goals can now be found in the care plan section) Acute Rehab PT Goals Patient Stated Goal: To get rehab prior to d/c home PT Goal Formulation: With patient Time For Goal Achievement: 04/18/17 Potential to Achieve Goals: Good Progress towards PT goals: Progressing toward goals    Frequency    7X/week      PT Plan Current plan remains appropriate    Co-evaluation              AM-PAC PT "6 Clicks" Daily Activity  Outcome Measure  Difficulty turning over in bed (including adjusting bedclothes, sheets and blankets)?: None Difficulty moving from lying on back to sitting on the side of the bed? : Total Difficulty  sitting down on and standing up from a chair with arms (e.g., wheelchair, bedside commode, etc,.)?: A Little Help needed moving to and from a bed to chair (including a wheelchair)?: A Little Help needed walking in hospital room?: A Little Help needed climbing 3-5 steps with a railing? : A Little 6 Click Score: 17    End of Session Equipment Utilized During Treatment: Gait belt Activity Tolerance: Patient limited by pain Patient left: in bed;with call bell/phone within reach Nurse Communication: Mobility status;Patient requests pain meds PT Visit Diagnosis: Difficulty in walking, not elsewhere classified (R26.2);Pain Pain - Right/Left: Left Pain - part of body: Knee     Time: 1000-1047 PT Time Calculation (min) (ACUTE ONLY): 47 min  Charges:  $Gait Training: 23-37 mins $Therapeutic Exercise: 8-22 mins                    G Codes:       Sarah Hanson, [place] Pager [phone] 04/17/2017    Sarah Hanson 04/17/2017, 11:21 AM

## 2017-04-17 NOTE — Plan of Care (Signed)
Problem: Safety: Goal: Ability to remain free from injury will improve Call bell and personal belongings within reach.  Patient voices understanding to call for assistance.   

## 2017-04-17 NOTE — Progress Notes (Signed)
Subjective: 3 Days Post-Op Procedure(s) (LRB): LEFT TOTAL KNEE ARTHROPLASTY (Left) Patient reports pain as moderate.  No acute changes.  Does not qualify for SNF.  Only has friend's house to go to.  Objective: Vital signs in last 24 hours: Temp:  [98.5 F (36.9 C)-99.2 F (37.3 C)] 98.5 F (36.9 C) (06/29 0602) Pulse Rate:  [89-114] 114 (06/29 0602) Resp:  [17-18] 17 (06/29 0602) BP: (123-144)/(63-80) 123/63 (06/29 0602) SpO2:  [94 %-98 %] 94 % (06/29 0602)  Intake/Output from previous day: 06/28 0701 - 06/29 0700 In: 1080 [P.O.:1080] Out: -  Intake/Output this shift: No intake/output data recorded.   Recent Labs  04/15/17 0618  HGB 12.8    Recent Labs  04/15/17 0618  WBC 19.8*  RBC 4.05  HCT 37.9  PLT 331    Recent Labs  04/15/17 0618  NA 135  K 4.1  CL 102  CO2 24  BUN 6  CREATININE 0.67  GLUCOSE 138*  CALCIUM 8.9   No results for input(s): LABPT, INR in the last 72 hours.  Sensation intact distally Intact pulses distally Dorsiflexion/Plantar flexion intact Incision: scant drainage No cellulitis present Compartment soft  Assessment/Plan: 3 Days Post-Op Procedure(s) (LRB): LEFT TOTAL KNEE ARTHROPLASTY (Left) Up with therapy Plan for discharge tomorrow Discharge home with home health  Kathryne HitchChristopher Y Mazzie Brodrick 04/17/2017, 6:53 AM

## 2017-04-17 NOTE — Discharge Instructions (Signed)

## 2017-04-18 NOTE — Progress Notes (Signed)
Physical Therapy Treatment Patient Details Name: Sarah Hanson MRN: [id number] DOB: Feb 05, 1971 Today's Date: 04/18/2017    History of Present Illness Patient is a 46 y/o female s/p L TKA. PMHx: Anxiety, Arthritis, ETOH abuse.    PT Comments    Pt lying supine in bed on arrival.  Pt reports, " Yall want me to do too much and I just can't."  Pt with ROM limitations and not following her knee precautions to rest in extension.  PTA locked out the bed for knee flexion to have better carryover with position.  Pillow placed under her ankle with education to avoid placing objects under the knee.  Pt refused OOB but agreeable to supine therapeutic exercises.  Limited flexion noted.  Pt encouraged to use her CPM but she refused.  Will f/u in the am for tx.     Follow Up Recommendations  Home health PT;Other (comment)     Equipment Recommendations  Rolling walker with 5" wheels;3in1 (PT)    Recommendations for Other Services       Precautions / Restrictions Precautions Precautions: Fall;Knee Precaution Booklet Issued: Yes (comment) Precaution Comments: reviewed knee extension as patient found with her knee wrap for ice under her knee.   Restrictions Weight Bearing Restrictions: Yes LLE Weight Bearing: Weight bearing as tolerated       Balance                             Cognition Arousal/Alertness: Awake/alert Behavior During Therapy: Agitated Overall Cognitive Status: Within Functional Limits for tasks assessed                                 General Comments: Pt better but remains to question why she is doing so much after surgery.        Exercises Total Joint Exercises Ankle Circles/Pumps: AROM;Both;10 reps;Supine Quad Sets: AROM;Left;10 reps;Supine Towel Squeeze: AROM;Both;10 reps;Supine Short Arc Quad: AROM;Left;10 reps;Supine Heel Slides: AROM;Left;10 reps;Supine;AAROM Hip ABduction/ADduction: AROM;Left;10 reps;Supine Straight Leg Raises:  AAROM;Left;10 reps;Supine Goniometric ROM: grossly 15-30 degrees with patient refusing to flex any further and refusing application of CPM.      General Comments        Pertinent Vitals/Pain Pain Assessment: 0-10 Pain Score: 7  Pain Location: L knee Pain Descriptors / Indicators: Aching;Sore Pain Intervention(s): Monitored during session;Repositioned    Home Living                      Prior Function            PT Goals (current goals can now be found in the care plan section) Acute Rehab PT Goals Patient Stated Goal: To be left alone to watch TV Potential to Achieve Goals: Good Progress towards PT goals: Progressing toward goals    Frequency    7X/week      PT Plan Current plan remains appropriate    Co-evaluation              AM-PAC PT "6 Clicks" Daily Activity  Outcome Measure  Difficulty turning over in bed (including adjusting bedclothes, sheets and blankets)?: None Difficulty moving from lying on back to sitting on the side of the bed? : None Difficulty sitting down on and standing up from a chair with arms (e.g., wheelchair, bedside commode, etc,.)?: None Help needed moving to and from a bed to chair (including  a wheelchair)?: A Little Help needed walking in hospital room?: A Little Help needed climbing 3-5 steps with a railing? : A Little 6 Click Score: 21    End of Session Equipment Utilized During Treatment: Gait belt Activity Tolerance: Patient limited by pain Patient left: in bed;with call bell/phone within reach Nurse Communication: Mobility status;Patient requests pain meds PT Visit Diagnosis: Difficulty in walking, not elsewhere classified (R26.2);Pain Pain - Right/Left: Left Pain - part of body: Knee     Time: [id number] PT Time Calculation (min) (ACUTE ONLY): 17 min  Charges: $Therapeutic Exercise: 8-22 mins                     G Codes:      Joycelyn RuaAimee Abdulrahman Bracey, PTA pager [phone]    Florestine AversAimee J Husein Guedes 04/18/2017, 4:11  PM

## 2017-04-18 NOTE — Progress Notes (Signed)
Occupational Therapy Treatment Patient Details Name: Sarah Hanson MRN: [id number] DOB: 02/21/1971 Today's Date: 04/18/2017    History of present illness Patient is a 46 y/o female s/p L TKA. PMHx: Anxiety, Arthritis, ETOH abuse.   OT comments  Pt. Able to complete bed mobility MOD I, and amb. To/from b.room with min guard a.  Introduced A/E for [employer].  Instructions and techniques difficult to provide as pt. Not receptive to instruction with frequent fluctuations in mood.    Follow Up Recommendations  SNF;Supervision/Assistance - 24 hour    Equipment Recommendations  Other (comment)    Recommendations for Other Services      Precautions / Restrictions Precautions Precautions: Fall;Knee Restrictions LLE Weight Bearing: Weight bearing as tolerated       Mobility Bed Mobility Overal bed mobility: Modified Independent Bed Mobility: Supine to Sit;Sit to Supine     Supine to sit: Modified independent (Device/Increase time) Sit to supine: Modified independent (Device/Increase time)   General bed mobility comments: hob flat, no rail able to transition into long sitting and guide BLEs oob, same in reverse for back to bed  Transfers Overall transfer level: Needs assistance Equipment used: Rolling walker (2 wheeled) Transfers: Sit to/from [employer] Sit to Stand: Supervision Stand pivot transfers: Supervision       General transfer comment: "just back up and get out of my way".  not following cues for same use of RW     Balance                                           ADL either performed or assessed with clinical judgement   ADL Overall ADL's : Needs assistance/impaired             Lower Body Bathing: Cueing for compensatory techniques;With adaptive equipment Lower Body Bathing Details (indicate cue type and reason): introduction of use a/e, pt. concerned about financial constraints will check on indigent eligibility        Lower Body Dressing Details (indicate cue type and reason): introduction of use a/e, pt. concerned about financial constraints will check on indigent eligibility Toilet Transfer: Min guard;Ambulation;RW   Toileting- Clothing Manipulation and Hygiene: Sit to/from stand;Supervision/safety Toileting - Clothing Manipulation Details (indicate cue type and reason): simulated as pt. states she did not have to actually go to the b.room     Functional mobility during ADLs: Min guard;Rolling walker General ADL Comments: pt. very verbally aggressive, impatient, antagonistic during session.  interupting all attempts at education and yelling at me "just get to the point your'e asking me how to do things and i cant get to the point.Marland Kitchengod i mean".  attempted redirection multiple times and expressed that she needed to be mutally respectful as i was here to help her.  she said "im really not a bitch i was just comfortable and you are interupting my routine".  pt. requesting i bring her extra items ie: socks so she can take them home "i have nothing".  polielty declined that we can not send her home with extra items.  informed her i would see if she is elibible for A/E.  pt. continued her banter with me that was not appropriate.       Vision       Perception     Praxis      Cognition Arousal/Alertness: Awake/alert Behavior During Therapy: Agitated  Overall Cognitive Status: Within Functional Limits for tasks assessed                                 General Comments: verbally aggressive, interrupting, attacking in nature. would recoil and apologize then repeat        Exercises     Shoulder Instructions       General Comments      Pertinent Vitals/ Pain       Pain Assessment: No/denies pain  Home Living                                          Prior Functioning/Environment              Frequency  Min 2X/week        Progress Toward Goals  OT  Goals(current goals can now be found in the care plan section)  Progress towards OT goals: Progressing toward goals     Plan Discharge plan remains appropriate    Co-evaluation                 AM-PAC PT "6 Clicks" Daily Activity     Outcome Measure   Help from another person eating meals?: None Help from another person taking care of personal grooming?: A Little Help from another person toileting, which includes using toliet, bedpan, or urinal?: A Little Help from another person bathing (including washing, rinsing, drying)?: A Lot Help from another person to put on and taking off regular upper body clothing?: A Little Help from another person to put on and taking off regular lower body clothing?: A Lot 6 Click Score: 17    End of Session Equipment Utilized During Treatment: Gait belt;Rolling walker  OT Visit Diagnosis: Other abnormalities of gait and mobility (R26.89);Pain Pain - Right/Left: Left Pain - part of body: Knee   Activity Tolerance Patient tolerated treatment well   Patient Left in bed;with call bell/phone within reach   Nurse Communication          Time: 5784-69620944-1005 OT Time Calculation (min): 21 min  Charges: OT General Charges $OT Visit: 1 Procedure OT Treatments $Self Care/Home Management : 8-22 mins   Robet LeuMorris, Michalla Ringer Lorraine, COTA/L 04/18/2017, 10:23 AM

## 2017-04-18 NOTE — Progress Notes (Deleted)
Patient had a emesis episode approximately 350 cc of food stuff. Anti emetic offered but patient declined.

## 2017-04-18 NOTE — Progress Notes (Signed)
   Subjective:  Patient reports pain as moderate.  C/o pain and lack of social support  Objective:   VITALS:   Vitals:   04/16/17 2055 04/17/17 0602 04/17/17 2050 04/18/17 0545  BP: (!) 144/80 123/63 111/64 111/68  Pulse: 89 (!) 114 68 (!) 105  Resp: 18 17    Temp: 99.2 F (37.3 C) 98.5 F (36.9 C) 98.4 F (36.9 C) 100 F (37.8 C)  TempSrc: Oral  Oral Oral  SpO2: 98% 94% 96% 92%  Weight:        Neurologically intact Neurovascular intact Sensation intact distally Intact pulses distally Dorsiflexion/Plantar flexion intact Incision: dressing C/D/I and no drainage No cellulitis present Compartment soft   Lab Results  Component Value Date   WBC 19.8 (H) 04/15/2017   HGB 12.8 04/15/2017   HCT 37.9 04/15/2017   MCV 93.6 04/15/2017   PLT 331 04/15/2017     Assessment/Plan:  4 Days Post-Op   - OT recommending SNF vs 24 h assistance; PT pending for today - DVT ppx - SCDs, ambulation, aspirin - WBAT operative extremity - Pain control - Discharge planning  Glee ArvinMichael Xu 04/18/2017, 12:34 PM [phone]

## 2017-04-18 NOTE — Progress Notes (Signed)
Physical Therapy Treatment Patient Details Name: Sarah Hanson MRN: [id number] DOB: [birth year] Today's Date: 04/18/2017    History of Present Illness Patient is a 46 y/o female s/p L TKA. PMHx: Anxiety, Arthritis, ETOH abuse.    PT Comments    Pt performed increased gait with max VCs for participation.  Pt remains anxious during session and verbally aggressive, then apologetic.  Pt continues to benefit from HHPT at d/c and she is progressing well with mobility.  Will f/u later this afternoon to review the rest of her HEP.     Follow Up Recommendations  Home health PT;Other (comment) (HH aide)     Equipment Recommendations  Rolling walker with 5" wheels;3in1 (PT) (all ready delivered in room.  )    Recommendations for Other Services       Precautions / Restrictions Precautions Precautions: Fall;Knee Precaution Booklet Issued: Yes (comment) Precaution Comments: reviewed Restrictions Weight Bearing Restrictions: Yes LLE Weight Bearing: Weight bearing as tolerated    Mobility  Bed Mobility Overal bed mobility: Modified Independent Bed Mobility: Supine to Sit;Sit to Supine     Supine to sit: Modified independent (Device/Increase time) Sit to supine: Modified independent (Device/Increase time)   General bed mobility comments: No assistance needed patient used her non surgical limb to advance surgical limb to edge of bed.    Transfers Overall transfer level: Needs assistance Equipment used: Rolling walker (2 wheeled) Transfers: Sit to/from [employer] Sit to Stand: Supervision Stand pivot transfers: Supervision       General transfer comment: Good technique.  No assistance needed.  "I can do this by myself." Pt performed transfer from bed and from commode.    Ambulation/Gait Ambulation/Gait assistance: Supervision Ambulation Distance (Feet): 100 Feet Assistive device: Rolling walker (2 wheeled) Gait Pattern/deviations: Step-through pattern;Trunk  flexed;Antalgic     General Gait Details: Pt required standing breaks x2 with poor safety leaning on RW with her elbows.  No LOB noted.  Cues for upper trunk control and RW safety.     Stairs Stairs: Yes   Stair Management: No rails;Forwards;With walker Number of Stairs: 2 General stair comments: Cues for sequencing and RW placement.  Despite step by step cueing, pt attempted to ascend with surgical limb and becomes frustrated when corrected.  Pt will require review of stair training.   Wheelchair Mobility    Modified Rankin (Stroke Patients Only)       Balance Overall balance assessment: Needs assistance Sitting-balance support: Feet supported;No upper extremity supported Sitting balance-Leahy Scale: Good       Standing balance-Leahy Scale: Fair                              Cognition Arousal/Alertness: Awake/alert Behavior During Therapy: Agitated Overall Cognitive Status: Within Functional Limits for tasks assessed                                 General Comments: verbally aggressive, interrupting, attacking in nature. would recoil and apologize then repeat      Exercises Total Joint Exercises Ankle Circles/Pumps: AROM;Both;10 reps;Supine Quad Sets: AROM;Left;10 reps;Supine Heel Slides: AROM;Left;10 reps;Supine;AAROM    General Comments        Pertinent Vitals/Pain Pain Assessment: 0-10 Pain Score: 10-Worst pain ever (reports her pain is a 25/10 despite laughing and smiling during session.  ) Pain Location: L knee Pain Descriptors / Indicators: Aching;Sore Pain  Intervention(s): Monitored during session;Repositioned    Home Living                      Prior Function            PT Goals (current goals can now be found in the care plan section) Acute Rehab PT Goals Potential to Achieve Goals: Good Progress towards PT goals: Progressing toward goals    Frequency    7X/week      PT Plan Current plan remains  appropriate    Co-evaluation              AM-PAC PT "6 Clicks" Daily Activity  Outcome Measure  Difficulty turning over in bed (including adjusting bedclothes, sheets and blankets)?: None Difficulty moving from lying on back to sitting on the side of the bed? : None Difficulty sitting down on and standing up from a chair with arms (e.g., wheelchair, bedside commode, etc,.)?: None Help needed moving to and from a bed to chair (including a wheelchair)?: A Little Help needed walking in hospital room?: A Little Help needed climbing 3-5 steps with a railing? : A Little 6 Click Score: 21    End of Session Equipment Utilized During Treatment: Gait belt Activity Tolerance: Patient limited by pain Patient left: in bed;with call bell/phone within reach Nurse Communication: Mobility status;Patient requests pain meds PT Visit Diagnosis: Difficulty in walking, not elsewhere classified (R26.2);Pain Pain - Right/Left: Left Pain - part of body: Knee     Time: 1330-1405 PT Time Calculation (min) (ACUTE ONLY): 35 min  Charges:  $Gait Training: 8-22 mins $Therapeutic Activity: 8-22 mins                    G Codes:       Joycelyn RuaAimee Isaia Hassell, PTA pager [phone]    Florestine AversAimee J Antavia Tandy 04/18/2017, 2:14 PM

## 2017-04-19 MED ORDER — KETOROLAC TROMETHAMINE 30 MG/ML IJ SOLN
30.0000 mg | Freq: Four times a day (QID) | INTRAMUSCULAR | Status: DC | PRN
  Administered 2017-04-19 – 2017-04-20 (×4): 30 mg via INTRAVENOUS
  Filled 2017-04-19 (×4): qty 1

## 2017-04-19 MED ORDER — FLUCONAZOLE 150 MG PO TABS
150.0000 mg | ORAL_TABLET | Freq: Once | ORAL | Status: AC
Start: 2017-04-19 — End: 2017-04-19
  Administered 2017-04-19: 150 mg via ORAL
  Filled 2017-04-19: qty 1

## 2017-04-19 NOTE — Discharge Summary (Signed)
Physician Discharge Summary      Patient ID: Sarah Hanson MRN: [id number] DOB/AGE: December 31, 1970 46 y.o.  Admit date: 04/14/2017 Discharge date: 04/19/2017  Admission Diagnoses:  Unilateral primary osteoarthritis, left knee  Discharge Diagnoses:  Principal Problem:   Unilateral primary osteoarthritis, left knee Active Problems:   Status post total knee replacement, left   Past Medical History:  Diagnosis Date  . Anxiety    no med at this time--pt quit due to $  . Arthritis   . ETOH abuse   . History of kidney stones   . Marijuana abuse     Surgeries: Procedure(s): LEFT TOTAL KNEE ARTHROPLASTY on 04/14/2017   Consultants (if any):   Discharged Condition: Improved  Hospital Course: Sarah Hanson is an 46 y.o. female who was admitted 04/14/2017 with a diagnosis of Unilateral primary osteoarthritis, left knee and went to the operating room on 04/14/2017 and underwent the above named procedures.    She was given perioperative antibiotics:  Anti-infectives    Start     Dose/Rate Route Frequency Ordered Stop   04/19/17 1000  fluconazole (DIFLUCAN) tablet 150 mg     150 mg Oral  Once 04/19/17 0826 04/19/17 1031   04/14/17 1900  ceFAZolin (ANCEF) IVPB 1 g/50 mL premix     1 g 100 mL/hr over 30 Minutes Intravenous Every 6 hours 04/14/17 1511 04/15/17 0120   04/14/17 1145  ceFAZolin (ANCEF) IVPB 2g/100 mL premix     2 g 200 mL/hr over 30 Minutes Intravenous To [facility] 04/13/17 1045 04/14/17 1248    .  She was given sequential compression devices, early ambulation, and aspirin for DVT prophylaxis.  She benefited maximally from the hospital stay and there were no complications.    Recent vital signs:  Vitals:   04/19/17 0400 04/19/17 0720  BP:  132/74  Pulse:  84  Resp: 18 18  Temp:  97.8 F (36.6 C)    Recent laboratory studies:  Lab Results  Component Value Date   HGB 12.8 04/15/2017   HGB 14.5 04/03/2017   Lab Results  Component Value Date   WBC  19.8 (H) 04/15/2017   PLT 331 04/15/2017   No results found for: INR Lab Results  Component Value Date   NA 135 04/15/2017   K 4.1 04/15/2017   CL 102 04/15/2017   CO2 24 04/15/2017   BUN 6 04/15/2017   CREATININE 0.67 04/15/2017   GLUCOSE 138 (H) 04/15/2017    Discharge Medications:   Allergies as of 04/19/2017      Reactions   No Known Allergies       Medication List    TAKE these medications   acetaminophen 500 MG tablet Commonly known as:  TYLENOL Take 1 tablet (500 mg total) by mouth every 6 (six) hours as needed.   aspirin 325 MG EC tablet Take 1 tablet (325 mg total) by mouth 2 (two) times daily after a meal.   BIOFREEZE EX Apply 1 application topically as needed (for pain).   bismuth subsalicylate 262 MG/15ML suspension Commonly known as:  PEPTO BISMOL Take 30 mLs by mouth every 4 (four) hours as needed for indigestion.   naproxen 375 MG tablet Commonly known as:  NAPROSYN Take 1 tablet (375 mg total) by mouth 2 (two) times daily.   naproxen sodium 220 MG tablet Commonly known as:  ANAPROX Take 220-440 mg by mouth 2 (two) times daily as needed (for pain).   oxyCODONE-acetaminophen 5-325 MG tablet Commonly  known as:  ROXICET Take 1-2 tablets by mouth every 4 (four) hours as needed.   tiZANidine 4 MG tablet Commonly known as:  ZANAFLEX Take 1 tablet (4 mg total) by mouth every 8 (eight) hours as needed for muscle spasms.            Durable Medical Equipment        Start     Ordered   04/16/17 1036  For home use only DME 3 n 1  Once     04/16/17 1036   04/14/17 1631  DME Walker rolling  Once    Question:  Patient needs a walker to treat with the following condition  Answer:  Status post total knee replacement, left   04/14/17 1630      Diagnostic Studies: Dg Knee Left Port  Result Date: 04/14/2017 CLINICAL DATA:  Status post left total knee joint prosthesis placement. EXAM: PORTABLE LEFT KNEE - 1-2 VIEW COMPARISON:  Preoperative exam of the  left knee dated January 07, 2017. FINDINGS: The patient has undergone total left knee joint prosthesis placement. Radiographic positioning of the prosthetic components is good. The native bone exhibits no acute abnormality. There is a small amount of air in the anterior soft tissues. IMPRESSION: No immediate postprocedure complication following left total knee joint prosthesis placement. Electronically Signed   By: David  SwazilandJordan M.D.   On: 04/14/2017 15:38    Disposition: 01-Home or Self Care  Discharge Instructions    Call MD / Call 911    Complete by:  As directed    If you experience chest pain or shortness of breath, CALL 911 and be transported to the hospital emergency room.  If you develope a fever above 101.5 F, pus (white drainage) or increased drainage or redness at the wound, or calf pain, call your surgeon's office.   Constipation Prevention    Complete by:  As directed    Drink plenty of fluids.  Prune juice may be helpful.  You may use a stool softener, such as Colace (over the counter) 100 mg twice a day.  Use MiraLax (over the counter) for constipation as needed.   Driving restrictions    Complete by:  As directed    No driving while taking narcotic pain meds.   Increase activity slowly as tolerated    Complete by:  As directed       Follow-up Information    [facility] Follow [facility].   Why:  A representative from [facility] will contact you to arrange start date and time for your therapy. Contact information: [address] [place] [address] [phone]        Kathryne HitchBlackman, Christopher Y, MD Follow up in 2 week(s).   Specialty:  Orthopedic Surgery Contact information: [address] [place] [address] [phone]            Signed: Glee ArvinMichael Xu 04/19/2017, 2:55 PM

## 2017-04-19 NOTE — Progress Notes (Addendum)
Notified by patient's nurse earlier in the day that pt was "tired of being here and ready to go home". Contacted on-call MD with update and orders were placed for patient to be discharged home. Patient's nurse went in to update patient about plan, and patient became upset saying she didn't have a ride home and that she thought she "had until Monday. I'm not ready to leave today, the guy's house isn't ready that I'm going to." She also stated that she didn't know how she would pay for her prescriptions. Contacted CSW and CM. CSW stated he could get patient a bus pass and CM sent a MATCH letter. This RN presented both to pt, who once again stated "I thought I had until Monday. The house isn't ready for me, I don't feel like I can leave now". Called on-call MD again with update, and received verbal confirmation to cancel D/C order and for patient to leave tomorrow. Orders placed and patient and her nurse updated. Will continue to monitor  MATCH letter placed on top of patient's chart beside patient's room.

## 2017-04-19 NOTE — Progress Notes (Signed)
A/E DELIVERY  Provided A/E kit for pt.  Pt. Was Educated on use of A/E and able to return demo on previous session.  All other acute OT goals met.  No other needs at this time.  OTR/L to sign off.  Romana Juniper, COTA/L

## 2017-04-19 NOTE — Progress Notes (Signed)
Pt continues to complain of pain rated 10/10, even though she is walking well and eating often. She asks for dilaudid IV even after she has taken oxycodone 15mg . Discussed need for her to decrease use of dilaudid since she will be going home soon.

## 2017-04-19 NOTE — Progress Notes (Signed)
Physical Therapy Treatment Patient Details Name: Sarah Hanson MRN: [id number] DOB: July 04, 1971 Today's Date: 04/19/2017    History of Present Illness Patient is a 46 y/o female s/p L TKA. PMHx: Anxiety, Arthritis, ETOH abuse.    PT Comments    Pt remains to require max VCs for participation in PT treatment.  Pt is modified independent with bed mobility and transfers.  She is requiring Supervision for gait and stair training with max VCs for sequencing.  Plan to return home with HHPT remains appropriate.  Pt continues to present with decreased L knee flexion.  She is agreeaable today to CPM application.     Follow Up Recommendations  Home health PT;Other (comment)     Equipment Recommendations  Rolling walker with 5" wheels;3in1 (PT)    Recommendations for Other Services       Precautions / Restrictions Precautions Precautions: Fall;Knee Precaution Comments: Reviewed knee precautions and need for use of CPM during her stay to improve knee flexion.   Restrictions Weight Bearing Restrictions: Yes LLE Weight Bearing: Weight bearing as tolerated    Mobility  Bed Mobility Overal bed mobility: Modified Independent Bed Mobility: Supine to Sit;Sit to Supine     Supine to sit: Modified independent (Device/Increase time) Sit to supine: Modified independent (Device/Increase time)   General bed mobility comments: No assistance needed patient used her non surgical limb to advance surgical limb to edge of bed.    Transfers Overall transfer level: Needs assistance Equipment used: Rolling walker (2 wheeled) Transfers: Sit to/from Stand Sit to Stand: Modified independent (Device/Increase time) Stand pivot transfers: Modified independent (Device/Increase time)       General transfer comment: Good technique.  No assistance needed.   Ambulation/Gait Ambulation/Gait assistance: Supervision Ambulation Distance (Feet): 100 Feet Assistive device: Rolling walker (2 wheeled) Gait  Pattern/deviations: Step-through pattern;Trunk flexed;Decreased stride length   Gait velocity interpretation: Below normal speed for age/gender General Gait Details: Cues for gait symmetry and upper trunk control.  Pt remains to take several rest breaks due to pain and discomfort.     Stairs Stairs: Yes   Stair Management: No rails;Forwards;With walker Number of Stairs: 2 General stair comments: Pt remains to require cues for sequencing and RW placement for curb negotiation.  Will attempt next session for improved carryover.    Wheelchair Mobility    Modified Rankin (Stroke Patients Only)       Balance Overall balance assessment: Needs assistance Sitting-balance support: Feet supported;No upper extremity supported Sitting balance-Leahy Scale: Good     Standing balance support: Bilateral upper extremity supported Standing balance-Leahy Scale: Fair                              Cognition Arousal/Alertness: Awake/alert Behavior During Therapy: Agitated Overall Cognitive Status: Within Functional Limits for tasks assessed                                 General Comments: Pt remains to report she is in too much pain and she is unable to tolerate this much activity.        Exercises Total Joint Exercises Goniometric ROM: Pt remains to present with grossly 30 degrees of flexion.  Deferred therapeutic exercise and placed pt in CPM in hopes to improve L knee flexion.      General Comments        Pertinent Vitals/Pain Pain Assessment: 0-10  Pain Score: 7  Pain Location: L knee Pain Descriptors / Indicators: Aching;Sore Pain Intervention(s): Monitored during session;Repositioned    Home Living                      Prior Function            PT Goals (current goals can now be found in the care plan section) Acute Rehab PT Goals Patient Stated Goal: To get a coke Potential to Achieve Goals: Fair Progress towards PT goals: Progressing  toward goals    Frequency    7X/week      PT Plan Current plan remains appropriate    Co-evaluation              AM-PAC PT "6 Clicks" Daily Activity  Outcome Measure  Difficulty turning over in bed (including adjusting bedclothes, sheets and blankets)?: None Difficulty moving from lying on back to sitting on the side of the bed? : None Difficulty sitting down on and standing up from a chair with arms (e.g., wheelchair, bedside commode, etc,.)?: None Help needed moving to and from a bed to chair (including a wheelchair)?: None Help needed walking in hospital room?: A Little Help needed climbing 3-5 steps with a railing? : A Little 6 Click Score: 22    End of Session Equipment Utilized During Treatment: Gait belt Activity Tolerance: Patient limited by pain Patient left: in bed;with call bell/phone within reach Nurse Communication: Mobility status;Patient requests pain meds PT Visit Diagnosis: Difficulty in walking, not elsewhere classified (R26.2);Pain Pain - Right/Left: Left Pain - part of body: Knee     Time: 1610-9604 PT Time Calculation (min) (ACUTE ONLY): 24 min  Charges:  $Gait Training: 23-37 mins                    G Codes:       Joycelyn Rua, PTA pager [phone]    Florestine Avers 04/19/2017, 2:24 PM

## 2017-04-19 NOTE — Progress Notes (Signed)
[facility] per Vance PeperSusan Brady CM note. DC order to home. For transportation needs please contact CSW prior to 4:30.

## 2017-04-19 NOTE — Progress Notes (Addendum)
Pharmacy Antibiotic Note  Sarah Hanson is a 46 y.o. female admitted on 04/14/2017 with vaginal yeast infection.  Pharmacy has been consulted for Fluconazole dosing.  Plan: Fluconazole 150mg  po x 1  Height: 5\' 2"  (157.5 cm) Weight: 203 lb 14.8 oz (92.5 kg) IBW/kg (Calculated) : 50.1  Temp (24hrs), Avg:97.8 F (36.6 C), Min:97.6 F (36.4 C), Max:98 F (36.7 C)   Recent Labs Lab 04/15/17 0618  WBC 19.8*  CREATININE 0.67    Estimated Creatinine Clearance: 94.1 mL/min (by C-G formula based on SCr of 0.67 mg/dL).    Allergies  Allergen Reactions  . No Known Allergies       Thank you for allowing pharmacy to be a part of this patient's care.  Alvester MorinKendra Krystal Delduca, B.S., PharmD Clinical Pharmacist [facility] 

## 2017-04-19 NOTE — Progress Notes (Signed)
PT Cancellation Note  Patient Details Name: Berle Mullmy R Hyndman MRN: [id number] DOB: 06/27/1971   Cancelled Treatment:    Reason Eval/Treat Not Completed: (P) Patient declined, no reason specified (Pt reports she just received pain medicine and she is finally comfortable.  PTA educated that she is medicated and now would be a good time.  Pt reports," I can't get medicine right after & now I am going to lay here & hurt & I am just not gonna do it.")   Alexander Mcauley J Aundria RudRogers 04/19/2017, 9:08 AM  Joycelyn RuaAimee Boone Gear, PTA pager [phone]

## 2017-04-20 NOTE — Progress Notes (Signed)
Patient discharge teaching complete. Meds, diet, follow up appointments, activity, incision care reviewed and all questions answered. Copy of instructions and prescriptions given to patient.

## 2017-04-20 NOTE — Progress Notes (Signed)
Physical Therapy Treatment Patient Details Name: Sarah Hanson R Iacovelli MRN: [id number] DOB: 1971-09-23 Today's Date: 04/20/2017    History of Present Illness Patient is a 46 y/o female s/p L TKA. PMHx: Anxiety, Arthritis, ETOH abuse.    PT Comments    Pt remains argumentative during session and reports she feels as if she is doing to much.  Pt perseverates on walking back and forth to the bathroom and how that is enough therapy for her.   PTA provided education on ROM and compliance with HEP and CPM use. Pt reports that she cannot tolerate it and she knows what is best for her body.  Pt to d/c home today.  Will not follow up in pm as patient reports she cannot tolerate.    Follow Up Recommendations  Home health PT;Other (comment)     Equipment Recommendations  Rolling walker with 5" wheels;3in1 (PT)    Recommendations for Other Services       Precautions / Restrictions Precautions Precautions: Fall;Knee Precaution Booklet Issued: Yes (comment) Precaution Comments: Reviewed knee precautions and need for use of CPM during her stay to improve knee flexion.  Reviewed not placing objects under her knee.   Restrictions Weight Bearing Restrictions: Yes LLE Weight Bearing: Weight bearing as tolerated    Mobility  Bed Mobility                  Transfers                    Ambulation/Gait                 Stairs         General stair comments: Verbally reviewed sequencing for stairs and patient remains unable to recall correct sequencing.  Re-educated on sequencing with little to no carryover.    Wheelchair Mobility    Modified Rankin (Stroke Patients Only)       Balance                                            Cognition Arousal/Alertness: Awake/alert Behavior During Therapy: Agitated Overall Cognitive Status: Within Functional Limits for tasks assessed                                 General Comments: Pt remains  to be verbally aggressive and agitated during therapy.  Pt sent PTA away this am.  PTA left to see another patient and gone less an hour, pt reports she has had a shower and this is just too much for her to tolerate this am.  "Where were you before I took a shower?"  Pt reports after therapeutic exercise that she has done "too much" and refused OOB mobility at this time.        Exercises Total Joint Exercises Ankle Circles/Pumps: AROM;Both;10 reps;Supine Quad Sets: AROM;Left;10 reps;Supine Towel Squeeze: AROM;Both;10 reps;Supine Short Arc Quad: AROM;Left;10 reps;Supine Heel Slides: AROM;Left;10 reps;Supine Hip ABduction/ADduction: AROM;Left;10 reps;Supine Straight Leg Raises: Left;10 reps;Supine;AROM Goniometric ROM: 32 degrees flexion L knee.      General Comments        Pertinent Vitals/Pain Pain Assessment: 0-10 Pain Score: 10-Worst pain ever Pain Location: L knee Pain Descriptors / Indicators: Aching;Sore Pain Intervention(s): Monitored during session;Repositioned;RN gave pain meds during session    Home Living  Prior Function            PT Goals (current goals can now be found in the care plan section) Acute Rehab PT Goals Patient Stated Goal: To lay in her bed and not hurt Potential to Achieve Goals: Fair Progress towards PT goals: Progressing toward goals    Frequency           PT Plan Current plan remains appropriate    Co-evaluation              AM-PAC PT "6 Clicks" Daily Activity  Outcome Measure  Difficulty turning over in bed (including adjusting bedclothes, sheets and blankets)?: None Difficulty moving from lying on back to sitting on the side of the bed? : None Difficulty sitting down on and standing up from a chair with arms (e.g., wheelchair, bedside commode, etc,.)?: None Help needed moving to and from a bed to chair (including a wheelchair)?: None Help needed walking in hospital room?: None Help needed  climbing 3-5 steps with a railing? : A Little (for sequencing) 6 Click Score: 23    End of Session   Activity Tolerance: Patient limited by pain (limited by anxeity) Patient left: in bed;with call bell/phone within reach Nurse Communication: Mobility status;Patient requests pain meds PT Visit Diagnosis: Difficulty in walking, not elsewhere classified (R26.2);Pain Pain - Right/Left: Left Pain - part of body: Knee     Time: 1610-9604 PT Time Calculation (min) (ACUTE ONLY): 19 min  Charges:  $Therapeutic Exercise: 8-22 mins                    G Codes:       Joycelyn Rua, PTA pager [phone]    Florestine Avers 04/20/2017, 10:21 AM

## 2017-04-20 NOTE — Discharge Summary (Signed)
Patient ID: Sarah Hanson MRN: [id number] DOB/AGE: October 27, 1970 46 y.o.  Admit date: 04/14/2017 Discharge date: 04/20/2017  Admission Diagnoses:  Principal Problem:   Unilateral primary osteoarthritis, left knee Active Problems:   Status post total knee replacement, left   Discharge Diagnoses:  Same  Past Medical History:  Diagnosis Date  . Anxiety    no med at this time--pt quit due to $  . Arthritis   . ETOH abuse   . History of kidney stones   . Marijuana abuse     Surgeries: Procedure(s): LEFT TOTAL KNEE ARTHROPLASTY on 04/14/2017   Consultants:   Discharged Condition: Improved  Hospital Course: Sarah Hanson is an 46 y.o. female who was admitted 04/14/2017 for operative treatment ofUnilateral primary osteoarthritis, left knee. Patient has severe unremitting pain that affects sleep, daily activities, and work/hobbies. After pre-op clearance the patient was taken to the operating room on 04/14/2017 and underwent  Procedure(s): LEFT TOTAL KNEE ARTHROPLASTY.    Patient was given perioperative antibiotics: Anti-infectives    Start     Dose/Rate Route Frequency Ordered Stop   04/19/17 1000  fluconazole (DIFLUCAN) tablet 150 mg     150 mg Oral  Once 04/19/17 0826 04/19/17 1031   04/14/17 1900  ceFAZolin (ANCEF) IVPB 1 g/50 mL premix     1 g 100 mL/hr over 30 Minutes Intravenous Every 6 hours 04/14/17 1511 04/15/17 0120   04/14/17 1145  ceFAZolin (ANCEF) IVPB 2g/100 mL premix     2 g 200 mL/hr over 30 Minutes Intravenous To ShortStay Surgical 04/13/17 1045 04/14/17 1248       Patient was given sequential compression devices, early ambulation, and chemoprophylaxis to prevent DVT.  Patient benefited maximally from hospital stay and there were no complications.    Recent vital signs: Patient Vitals for the past 24 hrs:  BP Temp Temp src Pulse Resp SpO2  04/20/17 0610 129/73 98.1 F (36.7 C) Oral 78 16 99 %  04/19/17 2213 (!) 130/94 98.1 F (36.7 C) Oral 86 18 99 %   04/19/17 1706 136/83 97.9 F (36.6 C) Oral 82 18 99 %  04/19/17 0720 132/74 97.8 F (36.6 C) Oral 84 18 95 %     Recent laboratory studies: No results for input(s): WBC, HGB, HCT, PLT, NA, K, CL, CO2, BUN, CREATININE, GLUCOSE, INR, CALCIUM in the last 72 hours.  Invalid input(s): PT, 2   Discharge Medications:   Allergies as of 04/20/2017      Reactions   No Known Allergies       Medication List    TAKE these medications   acetaminophen 500 MG tablet Commonly known as:  TYLENOL Take 1 tablet (500 mg total) by mouth every 6 (six) hours as needed.   aspirin 325 MG EC tablet Take 1 tablet (325 mg total) by mouth 2 (two) times daily after a meal.   BIOFREEZE EX Apply 1 application topically as needed (for pain).   bismuth subsalicylate 262 MG/15ML suspension Commonly known as:  PEPTO BISMOL Take 30 mLs by mouth every 4 (four) hours as needed for indigestion.   naproxen 375 MG tablet Commonly known as:  NAPROSYN Take 1 tablet (375 mg total) by mouth 2 (two) times daily.   naproxen sodium 220 MG tablet Commonly known as:  ANAPROX Take 220-440 mg by mouth 2 (two) times daily as needed (for pain).   oxyCODONE-acetaminophen 5-325 MG tablet Commonly known as:  ROXICET Take 1-2 tablets by mouth every 4 (four) hours as  needed.   tiZANidine 4 MG tablet Commonly known as:  ZANAFLEX Take 1 tablet (4 mg total) by mouth every 8 (eight) hours as needed for muscle spasms.            Durable Medical Equipment        Start     Ordered   04/16/17 1036  For home use only DME 3 n 1  Once     04/16/17 1036   04/14/17 1631  DME Walker rolling  Once    Question:  Patient needs a walker to treat with the following condition  Answer:  Status post total knee replacement, left   04/14/17 1630      Diagnostic Studies: Dg Knee Left Port  Result Date: 04/14/2017 CLINICAL DATA:  Status post left total knee joint prosthesis placement. EXAM: PORTABLE LEFT KNEE - 1-2 VIEW COMPARISON:   Preoperative exam of the left knee dated January 07, 2017. FINDINGS: The patient has undergone total left knee joint prosthesis placement. Radiographic positioning of the prosthetic components is good. The native bone exhibits no acute abnormality. There is a small amount of air in the anterior soft tissues. IMPRESSION: No immediate postprocedure complication following left total knee joint prosthesis placement. Electronically Signed   By: David  [place] M.D.   On: 04/14/2017 15:38    Disposition: 01-Home or Self Care  Discharge Instructions    Call MD / Call 911    Complete by:  As directed    If you experience chest pain or shortness of breath, CALL 911 and be transported to the hospital emergency room.  If you develope a fever above 101.5 F, pus (white drainage) or increased drainage or redness at the wound, or calf pain, call your surgeon's office.   Constipation Prevention    Complete by:  As directed    Drink plenty of fluids.  Prune juice may be helpful.  You may use a stool softener, such as Colace (over the counter) 100 mg twice a day.  Use MiraLax (over the counter) for constipation as needed.   Discharge patient    Complete by:  As directed    Discharge disposition:  01-Home or Self Care   Discharge patient date:  04/20/2017   Driving restrictions    Complete by:  As directed    No driving while taking narcotic pain meds.   Increase activity slowly as tolerated    Complete by:  As directed       Follow-up Information    [facility] Follow [facility].   Why:  A representative from [facility] will contact you to arrange start date and time for your therapy. Contact information: [address] [place] [address] [phone]        Kathryne Hitch, MD Follow up in 2 week(s).   Specialty:  Orthopedic Surgery Contact information: [address] [place] [address] [phone]            Signed: Kathryne Hitch 04/20/2017, 7:02 AM

## 2017-04-20 NOTE — Progress Notes (Signed)
Patient discharged via wheelchair accompanied with volunteer staff and friend. Belongings sent with patient along with BSC and walker.

## 2017-04-20 NOTE — Care Management (Signed)
Case manager spoke with patient this AM, reminding her of the home health plan that was arranged on Last week,. [facility] will be providing therapy, patient has [facility]W and 3in1 in room. She says she will be going to friend's home: [address], [place], [place]. Patient has no further needs at this time.

## 2017-04-20 NOTE — Progress Notes (Signed)
PT Cancellation Note  Patient Details Name: Sarah Hanson MRN: [id number] DOB: 1970/12/29   Cancelled Treatment:    Reason Eval/Treat Not Completed: (P) Patient declined, no reason specified (Report her breakfast tray was messed up this am and she wants to finish eating.  Pt reports, " hurry back I got to get a nap soon."  )   Chandell Attridge J Aundria RudRogers 04/20/2017, 9:07 AM  Joycelyn RuaAimee Tera Pellicane, PTA pager [phone]

## 2017-04-20 NOTE — Progress Notes (Signed)
Patient ID: Sarah Hanson, female   DOB: Jan 25, 1971, 46 y.o.   MRN: [id number] Doing well overall.  Knee stable.  Vitals stable.  Can be discharged to home today.

## 2017-04-21 ENCOUNTER — Telehealth (INDEPENDENT_AMBULATORY_CARE_PROVIDER_SITE_OTHER): Payer: Self-pay | Admitting: *Deleted

## 2017-04-21 NOTE — Telephone Encounter (Signed)
[facility]on from [facility] called, plan of care for pt is 2x a week for 4 week. He needs verbal. [phone]

## 2017-04-21 NOTE — Telephone Encounter (Signed)
Verbal order given  

## 2017-04-23 ENCOUNTER — Telehealth (INDEPENDENT_AMBULATORY_CARE_PROVIDER_SITE_OTHER): Payer: Self-pay | Admitting: Family Medicine

## 2017-04-23 ENCOUNTER — Telehealth (INDEPENDENT_AMBULATORY_CARE_PROVIDER_SITE_OTHER): Payer: Self-pay | Admitting: Orthopaedic Surgery

## 2017-04-23 NOTE — Telephone Encounter (Signed)
PLEASE CALL PT REGARDING OUTPT PT.  [phone]

## 2017-04-24 ENCOUNTER — Telehealth (INDEPENDENT_AMBULATORY_CARE_PROVIDER_SITE_OTHER): Payer: Self-pay | Admitting: *Deleted

## 2017-04-24 NOTE — Telephone Encounter (Signed)
She called me twice on July 4th with the same issue and I told her the same thing

## 2017-04-24 NOTE — Telephone Encounter (Signed)
Physical therapy came in to do some PT on pt and stated that pt is c/o pain, he just her BP and it is 185/100, states pt claims has never had high bp before. Pt has had  L TKR , pt is anxious, crying and wants to know what needs to be done. Please call back at [phone]

## 2017-04-24 NOTE — Telephone Encounter (Signed)
FYI--- Patient states she was going to the ER for this issue since she doesn't have a PCP I told her if she is feeling fine other than her knee pain that this BP may just be from her "pain" I told her if she was having dizziness, headache, chest pain-than ofcourse at that time she needs to go to the ER

## 2017-04-27 ENCOUNTER — Telehealth (INDEPENDENT_AMBULATORY_CARE_PROVIDER_SITE_OTHER): Payer: Self-pay | Admitting: Orthopaedic Surgery

## 2017-04-27 NOTE — Telephone Encounter (Signed)
LMOM for patient letting her know we can get her that Rx tomorrow at her appt

## 2017-04-27 NOTE — Telephone Encounter (Signed)
Patient called advised she is out of pain medication (Oxycodone) Patient advised she has an appointment tomorrow. The number to contact patient is [phone]

## 2017-04-27 NOTE — Telephone Encounter (Signed)
Please advise 

## 2017-04-27 NOTE — Telephone Encounter (Signed)
She will have to wait until her visit tomorrow since it can not be called in

## 2017-04-28 ENCOUNTER — Other Ambulatory Visit (INDEPENDENT_AMBULATORY_CARE_PROVIDER_SITE_OTHER): Payer: Self-pay

## 2017-04-28 ENCOUNTER — Ambulatory Visit (INDEPENDENT_AMBULATORY_CARE_PROVIDER_SITE_OTHER): Payer: Self-pay | Admitting: Orthopaedic Surgery

## 2017-04-28 DIAGNOSIS — Z96652 Presence of left artificial knee joint: Secondary | ICD-10-CM

## 2017-04-28 MED ORDER — IBUPROFEN 800 MG PO TABS: 800.0000 mg | ORAL_TABLET | Freq: Three times a day (TID) | ORAL | 0 refills | Status: DC | PRN

## 2017-04-28 MED ORDER — TIZANIDINE HCL 4 MG PO TABS: 4.0000 mg | ORAL_TABLET | Freq: Three times a day (TID) | ORAL | 0 refills | Status: DC | PRN

## 2017-04-28 MED ORDER — OXYCODONE-ACETAMINOPHEN 5-325 MG PO TABS: 1.0000 | ORAL_TABLET | Freq: Four times a day (QID) | ORAL | 0 refills | Status: DC | PRN

## 2017-04-28 NOTE — Progress Notes (Signed)
The patient is 2 weeks status post a left total knee arthroplasty. She's had a struggle with this due to her young age and the severe pain she is having. Also she has a tough social situation and getting therapy is been difficult. On examination of her left knee her incisions well-healed. I removed the old Steri-Strips and placed knee Steri-Strips. Her calf is soft. Her swelling is only mild. Her extension is almost full but her flexion is only about 60.  I talked her extensively about getting her knee bending. It may be difficult for try to get her set up for outpatient physical therapy. I did refill her oxycodone as well as Zanaflex and 800 mg ibuprofen. I counseled her about narcotics use as well. We'll see her back in 4 weeks and see how she is doing overall but no x-rays are needed.

## 2017-05-05 ENCOUNTER — Telehealth (INDEPENDENT_AMBULATORY_CARE_PROVIDER_SITE_OTHER): Payer: Self-pay | Admitting: Orthopaedic Surgery

## 2017-05-05 NOTE — Telephone Encounter (Signed)
Please advise 

## 2017-05-05 NOTE — Telephone Encounter (Signed)
PT REQUEST REFILL OF PAIN MEDS PLEASE.  [phone]

## 2017-05-06 ENCOUNTER — Ambulatory Visit: Payer: Self-pay | Attending: Orthopaedic Surgery | Admitting: Physical Therapy

## 2017-05-06 DIAGNOSIS — R2689 Other abnormalities of gait and mobility: Secondary | ICD-10-CM | POA: Insufficient documentation

## 2017-05-06 DIAGNOSIS — R262 Difficulty in walking, not elsewhere classified: Secondary | ICD-10-CM | POA: Insufficient documentation

## 2017-05-06 DIAGNOSIS — M25562 Pain in left knee: Secondary | ICD-10-CM | POA: Insufficient documentation

## 2017-05-06 DIAGNOSIS — M25662 Stiffness of left knee, not elsewhere classified: Secondary | ICD-10-CM | POA: Insufficient documentation

## 2017-05-06 MED ORDER — OXYCODONE-ACETAMINOPHEN 5-325 MG PO TABS: 1.0000 | ORAL_TABLET | Freq: Four times a day (QID) | ORAL | 0 refills | Status: DC | PRN

## 2017-05-06 NOTE — Therapy (Signed)
[facility] [address]  [place], [place], [address] Phone: [phone]   Fax:  [phone]  Physical Therapy Evaluation  Patient Details  Name: Sarah Hanson MRN: [id number] Date of Birth: Sep 21, 1971 Referring Provider: Dr. Jean Rosenthal  Encounter Date: 05/06/2017      PT End of Session - 05/06/17 1516    Visit Number 1   Number of Visits 24   Date for PT Re-Evaluation 07/01/17   Authorization Type [facility]   PT Start Time 3875   PT Stop Time 1530   PT Time Calculation (min) 47 min   Activity Tolerance Patient tolerated treatment well   Behavior During Therapy [facility] for tasks assessed/performed      Past Medical History:  Diagnosis Date  . Anxiety    no med at this time--pt quit due to $  . Arthritis   . ETOH abuse   . History of kidney stones   . Marijuana abuse     Past Surgical History:  Procedure Laterality Date  . CESAREAN SECTION    . CESAREAN SECTION     x2  . TOTAL KNEE ARTHROPLASTY Left 04/14/2017   Procedure: LEFT TOTAL KNEE ARTHROPLASTY;  Surgeon: Mcarthur Rossetti, MD;  Location: [facility];  Service: Orthopedics;  Laterality: Left;  . TUBAL LIGATION      There were no vitals filed for this visit.       Subjective Assessment - 05/06/17 1443    Subjective Patient s/p elective L TKA on 04/14/17 - tried conservative treatment with no relief. Has had home health services up until this point. Has moved from [place] to [facility]. Currently with steri-strips over incision - incision appears clean and well-kept. Did work at [employer] - does not plan to return.    Pertinent History none   Patient Stated Goals "just to be able to walk without pain"   Currently in Pain? Yes   Pain Score 2    Pain Location Knee   Pain Orientation Left   Pain Descriptors / Indicators Aching;Tightness;Sore   Pain Type Surgical pain   Pain Frequency Intermittent   Aggravating Factors  mornings/nights -  stiffness   Pain Relieving Factors ice            [facility] PT Assessment - 05/06/17 1448      Assessment   Medical Diagnosis L TKA   Referring Provider Dr. Jean Rosenthal   Onset Date/Surgical Date 04/14/17   Next MD Visit 05/27/17   Prior Therapy yes - [facility]     Precautions   Precautions None     Restrictions   Weight Bearing Restrictions No     Balance Screen   Has the patient fallen in the past 6 months No   Has the patient had a decrease in activity level because of a fear of falling?  No   Is the patient reluctant to leave their home because of a fear of falling?  No     Home Environment   Living Environment Private residence   Living Arrangements Non-relatives/Friends   Type of [place] to enter   Entrance Stairs-Number of Steps 1   [place] One level;Able to live on main level with bedroom/bathroom   [place] - single point     Prior Function   Level of Independence Independent   Vocation Unemployed   Leisure "I'm a workaholic"     Cognition  Overall Cognitive Status Within Functional Limits for tasks assessed     Observation/Other Assessments   Focus on Therapeutic Outcomes (FOTO)  Knee: 46 (54% limited, predicted 33% limited)     Sensation   Light Touch Appears Intact     Coordination   Gross Motor Movements are Fluid and Coordinated Yes     Posture/Postural Control   Posture/Postural Control Postural limitations   Postural Limitations Rounded Shoulders;Forward head     ROM / Strength   AROM / PROM / Strength AROM;PROM;Strength     AROM   AROM Assessment Site Knee   Right/Left Knee Right;Left   Right Knee Extension -1   Right Knee Flexion 133   Left Knee Extension 7   Left Knee Flexion 66     PROM   PROM Assessment Site Knee   Right/Left Knee Left   Left Knee Extension 5   Left Knee Flexion 80     Strength   Strength Assessment Site Hip;Knee;Ankle   Right/Left Hip Right;Left   Right Hip  Flexion 4+/5   Left Hip Flexion 3+/5   Right/Left Knee Right;Left   Right Knee Flexion 5/5   Right Knee Extension 5/5   Left Knee Flexion 4/5   Left Knee Extension 3+/5   Right/Left Ankle Right;Left   Right Ankle Dorsiflexion 5/5   Left Ankle Dorsiflexion 5/5            Objective measurements completed on examination: See above findings.          [facility] Adult PT Treatment/Exercise - 05/06/17 1448      Exercises   Exercises Knee/Hip     Knee/Hip Exercises: Aerobic   Stationary Bike 3 min for ROM     Knee/Hip Exercises: Seated   Long Arc Quad Left;10 reps     Knee/Hip Exercises: Supine   Quad Sets Left;10 reps   Quad Sets Limitations with foot propped   Short Arc [employer] Left;10 reps   Short Arc Quad Sets Limitations 5 sec hold   Heel Slides AAROM;Left;10 reps   Heel Slides Limitations 10 sec hold   Straight Leg Raises Left;10 reps                PT Education - 05/06/17 1516    Education provided Yes   Education Details exam findings, POC, HEP   Person(s) Educated Patient   Methods Explanation;Demonstration;Handout   Comprehension Need further instruction          PT Short Term Goals - 05/06/17 1518      PT SHORT TERM GOAL #1   Title patient to be independent with initial HEP for stretching and strengthening (all STGs to be met by 06/03/17)   Status New     PT SHORT TERM GOAL #2   Title Patient to improve L knee AROM to 0-100 needed for improved gait mechanics   Status New           PT Long Term Goals - 05/06/17 1519      PT LONG TERM GOAL #1   Title patient to be independent with advanced HEP (all LTGs to be met by 07/01/17)   Status New     PT LONG TERM GOAL #2   Title Patient to improve L knee AROM to 0-110 needed for improved gait mechanics and stair navigation wihtout compensation    Status New     PT LONG TERM GOAL #3   Title Patient to demonstrate L SLR without extensor lag  Status New     PT LONG TERM GOAL #4    Title Patient to demonstrate good heel toe gait mechanics over various surfaces with no AD and no evidence of instability   Status New                Plan - 05/06/17 1517    Clinical Impression Statement Patient is a 46 y/o female presenting to [facility] today s/p elective L TKA on 04/14/17. Patient today ambulating with SPC with poor gait mechanics likely due to ROM and strength deficits. Patient lacking a good bit of AROM with ability to only achieve 7-66 in supine. Patient tolerating all initial HEP activities today with some pain reported during heel slides and quad sets into extension. Patient to beneift from PT to address strength, ROM and gait deficits to improve maximize functional mobility.    Clinical Presentation Stable   Clinical Presentation due to: no co-morbidities affecting POC   Clinical Decision Making Low   Rehab Potential Good   PT Frequency 3x / week  hopeful to taper to 2x/week   PT Duration 8 weeks   PT Treatment/Interventions ADLs/Self Care Home Management;Cryotherapy;Electrical Stimulation;Moist Heat;Ultrasound;Neuromuscular re-education;Balance training;Therapeutic exercise;Therapeutic activities;Functional mobility training;Stair training;Gait training;Patient/family education;Manual techniques;Scar mobilization;Passive range of motion;Vasopneumatic Device;Taping;Dry needling   Consulted and Agree with Plan of Care Patient      Patient will benefit from skilled therapeutic intervention in order to improve the following deficits and impairments:  Abnormal gait, Decreased activity tolerance, Decreased balance, Decreased mobility, Decreased strength, Difficulty walking, Pain  Visit Diagnosis: Acute pain of left knee - Plan: PT plan of care cert/re-cert  Stiffness of left knee, not elsewhere classified - Plan: PT plan of care cert/re-cert  Difficulty in walking, not elsewhere classified - Plan: PT plan of care cert/re-cert  Other abnormalities of gait and mobility  - Plan: PT plan of care cert/re-cert     Problem List Patient Active Problem List   Diagnosis Date Noted  . Status post total knee replacement, left 04/14/2017  . Unilateral primary osteoarthritis, left knee 02/23/2017  . Left knee pain 12/15/2016     Lanney Gins, PT, DPT 05/06/17 4:38 PM   [facility] [address]  [place], [place], [address] Phone: [phone]   Fax:  [phone]  Name: ALYZE LAUF MRN: [id number] Date of Birth: November 09, 1970

## 2017-05-06 NOTE — Telephone Encounter (Signed)
She can come and pick up a script, but she has to try to make them last longer.

## 2017-05-06 NOTE — Patient Instructions (Signed)
Quad Set   With other leg bent, foot flat, slowly tighten muscles on thigh of straight leg while counting out loud to __5__. Repeat with other leg. Repeat _15___ times. Do __2__ sessions per day. **Have a prop under foot**  Strengthening: Straight Leg Raise   Tighten muscles on front of left thigh, then lift leg _6-8___ inches from surface, keeping knee locked.  Repeat _15___ times per set. Do _2-3___ sessions per day.  Short Arc [employer]ad   Place a large can or rolled towel under Left leg. Straighten knee and leg. Hold __5__ seconds.  Repeat _15___ times. Do __2__ sessions per day.  Heel Slide   Bend knee and pull heel toward buttocks. Hold __10__ seconds. Return.  Repeat _15___ times. Do _2-3___ sessions per day.  Long [employer] Quad   Straighten operated leg and try to hold it __5__ seconds. Repeat __15__ times. Do __2__ sessions a day.

## 2017-05-06 NOTE — Telephone Encounter (Signed)
I called patient and advised Rx ready to pickup

## 2017-05-11 ENCOUNTER — Ambulatory Visit: Payer: Self-pay

## 2017-05-12 ENCOUNTER — Ambulatory Visit: Payer: Self-pay | Admitting: Physical Therapy

## 2017-05-12 DIAGNOSIS — M25662 Stiffness of left knee, not elsewhere classified: Secondary | ICD-10-CM

## 2017-05-12 DIAGNOSIS — M25562 Pain in left knee: Secondary | ICD-10-CM

## 2017-05-12 DIAGNOSIS — R2689 Other abnormalities of gait and mobility: Secondary | ICD-10-CM

## 2017-05-12 DIAGNOSIS — R262 Difficulty in walking, not elsewhere classified: Secondary | ICD-10-CM

## 2017-05-12 NOTE — Therapy (Signed)
[facility] [place] [address]  [address] [place], [place], [address] Phone: [phone]   Fax:  [phone]  Physical Therapy Treatment  Patient Details  Name: Sarah Hanson MRN: [id number] Date of Birth: 04/21/1971 Referring Provider: Dr. Christopher Blackman  Encounter Date: 05/12/2017      PT End of Session - 05/12/17 1401    Visit Number 2   Number of Visits 24   Date for PT Re-Evaluation 07/01/17   Authorization Type [facility] [employer]   PT Start Time 1359   PT Stop Time 1444   PT Time Calculation (min) 45 min   Activity Tolerance Patient tolerated treatment well   Behavior During Therapy WFL for tasks assessed/performed      Past Medical History:  Diagnosis Date  . Anxiety    no med at this time--pt quit due to $  . Arthritis   . ETOH abuse   . History of kidney stones   . Marijuana abuse     Past Surgical History:  Procedure Laterality Date  . CESAREAN SECTION    . CESAREAN SECTION     x2  . TOTAL KNEE ARTHROPLASTY Left 04/14/2017   Procedure: LEFT TOTAL KNEE ARTHROPLASTY;  Surgeon: Blackman, Christopher Y, MD;  Location: [facility];  Service: Orthopedics;  Laterality: Left;  . TUBAL LIGATION      There were no vitals filed for this visit.      Subjective Assessment - 05/12/17 1400    Subjective Patient reporting good compliance with HEP. Still having a good bit of pain   Patient Stated Goals "just to be able to walk without pain"   Currently in Pain? Yes   Pain Score 5    Pain Location Knee   Pain Orientation Left   Pain Descriptors / Indicators Tightness;Sore   Pain Type Surgical pain            [facility] PT Assessment - 05/12/17 0001      AROM   AROM Assessment Site Knee   Right/Left Knee Left   Left Knee Extension 3   Left Knee Flexion 72                     [facility] Adult PT Treatment/Exercise - 05/12/17 1403      Knee/Hip Exercises: Aerobic   Stationary Bike 6 min for ROM -  partial revolutions     Knee/Hip Exercises: Standing   Functional Squat 15 reps   Functional Squat Limitations mirror for feedback for equal weight shift     Knee/Hip Exercises: Seated   Long Arc Quad Left;15 reps;Weights   Long Arc Quad Weight 1 lbs.   Long Arc Quad Limitations 5 sec hold   Hamstring Curl Left;15 reps   Hamstring Limitations red tband     Knee/Hip Exercises: Supine   Short Arc Quad Sets Left;15 reps   Short Arc Quad Sets Limitations 2#   Heel Slides AAROM;Left;15 reps   Heel Slides Limitations 5 sec hold - use of towel   Bridges Limitations 15 reps; 3-5 sec hold   Straight Leg Raises Left;15 reps   Straight Leg Raises Limitations 2# - good extension - some quad lag                  PT Short Term Goals - 05/12/17 1421      PT SHORT TERM GOAL #1   Title patient to be independent with initial HEP for stretching and strengthening (  all STGs to be met by 06/03/17)   Status On-going     PT SHORT TERM GOAL #2   Title Patient to improve L knee AROM to 0-100 needed for improved gait mechanics   Status On-going           PT Long Term Goals - 05/12/17 1422      PT LONG TERM GOAL #1   Title patient to be independent with advanced HEP (all LTGs to be met by 07/01/17)   Status On-going     PT LONG TERM GOAL #2   Title Patient to improve L knee AROM to 0-110 needed for improved gait mechanics and stair navigation wihtout compensation    Status On-going     PT LONG TERM GOAL #3   Title Patient to demonstrate L SLR without extensor lag   Status On-going     PT LONG TERM GOAL #4   Title Patient to demonstrate good heel toe gait mechanics over various surfaces with no AD and no evidence of instability   Status On-going               Plan - 05/12/17 1402    Clinical Impression Statement Patient doing well today - reports good compliance with HEP. Continues to ambualte with SPC with reduced gait mechanics likely due to ROM and strength deficits.  Patient doing well with all activities during PT treatment today with good tolerance. Improvement in AROM today to 3-72. Patient to continue to benefit form PT to maximize functional mobility.    PT Treatment/Interventions ADLs/Self Care Home Management;Cryotherapy;Electrical Stimulation;Moist Heat;Ultrasound;Neuromuscular re-education;Balance training;Therapeutic exercise;Therapeutic activities;Functional mobility training;Stair training;Gait training;Patient/family education;Manual techniques;Scar mobilization;Passive range of motion;Vasopneumatic Device;Taping;Dry needling   Consulted and Agree with Plan of Care Patient      Patient will benefit from skilled therapeutic intervention in order to improve the following deficits and impairments:  Abnormal gait, Decreased activity tolerance, Decreased balance, Decreased mobility, Decreased strength, Difficulty walking, Pain  Visit Diagnosis: Acute pain of left knee  Stiffness of left knee, not elsewhere classified  Difficulty in walking, not elsewhere classified  Other abnormalities of gait and mobility     Problem List Patient Active Problem List   Diagnosis Date Noted  . Status post total knee replacement, left 04/14/2017  . Unilateral primary osteoarthritis, left knee 02/23/2017  . Left knee pain 12/15/2016     Lanney Gins, PT, DPT 05/12/17 2:45 PM   [facility] [place] [address]  [place], [place], [address] Phone: [phone]   Fax:  [phone]  Name: Sarah Hanson MRN: [id number] Date of Birth: 1971/07/15

## 2017-05-13 ENCOUNTER — Telehealth (INDEPENDENT_AMBULATORY_CARE_PROVIDER_SITE_OTHER): Payer: Self-pay | Admitting: Orthopaedic Surgery

## 2017-05-13 MED ORDER — OXYCODONE-ACETAMINOPHEN 5-325 MG PO TABS: 1.0000 | ORAL_TABLET | Freq: Three times a day (TID) | ORAL | 0 refills | Status: DC | PRN

## 2017-05-13 NOTE — Telephone Encounter (Signed)
Please advise 

## 2017-05-13 NOTE — Telephone Encounter (Signed)
She can pick up one more prescription of Percocet but this is the last one I can prescribe because were getting it to close together. The next time it'll have to be hydrocodone

## 2017-05-13 NOTE — Telephone Encounter (Signed)
Patient aware Rx ready at front desk  

## 2017-05-13 NOTE — Telephone Encounter (Signed)
Pt has only one day left of oxycodone 5. Her next appt is 06/01/17. Pt states a lot of pain especially after physical therapy. Please call in more [facility] [address] [place]. Call pt [phone].

## 2017-05-14 ENCOUNTER — Telehealth (INDEPENDENT_AMBULATORY_CARE_PROVIDER_SITE_OTHER): Payer: Self-pay | Admitting: Orthopaedic Surgery

## 2017-05-14 ENCOUNTER — Ambulatory Visit: Payer: Self-pay

## 2017-05-14 DIAGNOSIS — M25662 Stiffness of left knee, not elsewhere classified: Secondary | ICD-10-CM

## 2017-05-14 DIAGNOSIS — M25562 Pain in left knee: Secondary | ICD-10-CM

## 2017-05-14 DIAGNOSIS — R2689 Other abnormalities of gait and mobility: Secondary | ICD-10-CM

## 2017-05-14 DIAGNOSIS — R262 Difficulty in walking, not elsewhere classified: Secondary | ICD-10-CM

## 2017-05-14 NOTE — Telephone Encounter (Signed)
Home Health Certification and Plan of Care faxed 05/14/17 °

## 2017-05-14 NOTE — Therapy (Signed)
[facility] [place] [address]  [place], [place], [address] Phone: [phone]   Fax:  [phone]  Physical Therapy Treatment  Patient Details  Name: Sarah Hanson MRN: [id number] Date of Birth: 1971/06/03 Referring Provider: Dr. Jean Rosenthal  Encounter Date: 05/14/2017      PT End of Session - 05/14/17 1425    Visit Number 3   Number of Visits 24   Date for PT Re-Evaluation 07/01/17   Authorization Type [facility]   PT Start Time 6962   PT Stop Time 9528   PT Time Calculation (min) 57 min   Activity Tolerance Patient tolerated treatment well   Behavior During Therapy [facility] for tasks assessed/performed      Past Medical History:  Diagnosis Date  . Anxiety    no med at this time--pt quit due to $  . Arthritis   . ETOH abuse   . History of kidney stones   . Marijuana abuse     Past Surgical History:  Procedure Laterality Date  . CESAREAN SECTION    . CESAREAN SECTION     x2  . TOTAL KNEE ARTHROPLASTY Left 04/14/2017   Procedure: LEFT TOTAL KNEE ARTHROPLASTY;  Surgeon: Mcarthur Rossetti, MD;  Location: [facility];  Service: Orthopedics;  Laterality: Left;  . TUBAL LIGATION      There were no vitals filed for this visit.      Subjective Assessment - 05/14/17 1424    Subjective Pt. noting she is still having pain with exercises at home wondering if this is normal.     Patient Stated Goals "just to be able to walk without pain"   Currently in Pain? Yes   Pain Score 5    Pain Location Knee   Pain Orientation Left   Pain Descriptors / Indicators Throbbing;Constant   Pain Frequency Constant   Pain Relieving Factors Ice,    Multiple Pain Sites No                         [facility] Adult PT Treatment/Exercise - 05/14/17 1431      Ambulation/Gait   Ambulation/Gait Yes   Ambulation/Gait Assistance 6: Modified independent (Device/Increase time)   Ambulation Distance (Feet) 200  Feet   Assistive device Straight cane   Pre-Gait Activities working to increase hip/knee flexion with "butt kicks" to improve mechanics; cues required for upright posture and wt. shift     Knee/Hip Exercises: Stretches   Passive Hamstring Stretch Left;2 reps;30 seconds   Passive Hamstring Stretch Limitations L with strap      Knee/Hip Exercises: Aerobic   Stationary Bike 6 min for ROM - partial revolutions     Knee/Hip Exercises: Standing   Terminal Knee Extension Limitations wall ball squeeze 5" x 10 reps      Knee/Hip Exercises: Supine   Heel Slides AAROM;Left;15 reps   Heel Slides Limitations 5 sec hold - use of towel   Straight Leg Raises Left;15 reps   Straight Leg Raises Limitations focusing on reducing quad lag    Knee Flexion Left;10 reps  5" hold    Knee Flexion Limitations with pillowcase on shoe and strap assist for stretch    Other Supine Knee/Hip Exercises B knee flexion stretch with heels one peanut p-ball 5" x 10 reps     Modalities   Modalities Vasopneumatic     Vasopneumatic   Number Minutes Vasopneumatic  10 minutes  Vasopnuematic Location  Knee  L    Vasopneumatic Pressure Medium   Vasopneumatic Temperature  coldest temp.      Manual Therapy   Manual Therapy Joint mobilization;Passive ROM;Muscle Energy Technique   Joint Mobilization L knee anterior/posterior mobs, patellar mobs all directions to improve ROM   Passive ROM L knee PROM flexion/extension with gentle stretch to improve ROM   Muscle Energy Technique HS/gastroc. contract/relax stretch with therapist x 3 cycles                PT Education - 05/14/17 1450    Education provided Yes   Education Details heel prop extension stretch, lunge flexion stretch    Person(s) Educated Patient   Methods Explanation;Demonstration;Verbal cues;Handout   Comprehension Verbalized understanding;Returned demonstration;Verbal cues required;Need further instruction          PT Short Term Goals -  05/12/17 1421      PT SHORT TERM GOAL #1   Title patient to be independent with initial HEP for stretching and strengthening (all STGs to be met by 06/03/17)   Status On-going     PT SHORT TERM GOAL #2   Title Patient to improve L knee AROM to 0-100 needed for improved gait mechanics   Status On-going           PT Long Term Goals - 05/12/17 1422      PT LONG TERM GOAL #1   Title patient to be independent with advanced HEP (all LTGs to be met by 07/01/17)   Status On-going     PT LONG TERM GOAL #2   Title Patient to improve L knee AROM to 0-110 needed for improved gait mechanics and stair navigation wihtout compensation    Status On-going     PT LONG TERM GOAL #3   Title Patient to demonstrate L SLR without extensor lag   Status On-going     PT LONG TERM GOAL #4   Title Patient to demonstrate good heel toe gait mechanics over various surfaces with no AD and no evidence of instability   Status On-going               Plan - 05/14/17 1427    Clinical Impression Statement Pt. noting she is still having pain with exercises at home and was encouraged to perform 2x/day for improved ROM and that pain levels are "normal" at this point.  Heavy manual focus today on improving flexion/extension ROM.  HEP updated with ROM activities.  Notable swelling to end treatment thus ended with ice/compression to reduce pain and post-exercise inflammation.   PT Treatment/Interventions ADLs/Self Care Home Management;Cryotherapy;Electrical Stimulation;Moist Heat;Ultrasound;Neuromuscular re-education;Balance training;Therapeutic exercise;Therapeutic activities;Functional mobility training;Stair training;Gait training;Patient/family education;Manual techniques;Scar mobilization;Passive range of motion;Vasopneumatic Device;Taping;Dry needling      Patient will benefit from skilled therapeutic intervention in order to improve the following deficits and impairments:  Abnormal gait, Decreased activity  tolerance, Decreased balance, Decreased mobility, Decreased strength, Difficulty walking, Pain  Visit Diagnosis: Acute pain of left knee  Stiffness of left knee, not elsewhere classified  Difficulty in walking, not elsewhere classified  Other abnormalities of gait and mobility     Problem List Patient Active Problem List   Diagnosis Date Noted  . Status post total knee replacement, left 04/14/2017  . Unilateral primary osteoarthritis, left knee 02/23/2017  . Left knee pain 12/15/2016    Bess Harvest, PTA 05/14/17 3:24 PM  [facility] [place] [address]  [address] [place], [place],  [place] Phone: [phone]   Fax:  [phone]  Name: Sarah Hanson MRN: [id number] Date of Birth: 1971/04/02

## 2017-05-15 ENCOUNTER — Ambulatory Visit: Payer: Self-pay | Admitting: Physical Therapy

## 2017-05-18 ENCOUNTER — Ambulatory Visit: Payer: Self-pay

## 2017-05-18 DIAGNOSIS — M25662 Stiffness of left knee, not elsewhere classified: Secondary | ICD-10-CM

## 2017-05-18 DIAGNOSIS — R2689 Other abnormalities of gait and mobility: Secondary | ICD-10-CM

## 2017-05-18 DIAGNOSIS — M25562 Pain in left knee: Secondary | ICD-10-CM

## 2017-05-18 DIAGNOSIS — R262 Difficulty in walking, not elsewhere classified: Secondary | ICD-10-CM

## 2017-05-18 NOTE — Therapy (Signed)
[facility] [place] [address]  [place], [place], [address] Phone: [phone]   Fax:  [phone]  Physical Therapy Treatment  Patient Details  Name: Sarah Hanson MRN: [id number] Date of Birth: Apr 30, 1971 Referring Provider: Dr. Jean Rosenthal  Encounter Date: 05/18/2017      PT End of Session - 05/18/17 1449    Visit Number 4   Number of Visits 24   Date for PT Re-Evaluation 07/01/17   Authorization Type [facility]   PT Start Time 1446   PT Stop Time 1545   PT Time Calculation (min) 59 min   Activity Tolerance Patient tolerated treatment well   Behavior During Therapy [facility] for tasks assessed/performed      Past Medical History:  Diagnosis Date  . Anxiety    no med at this time--pt quit due to $  . Arthritis   . ETOH abuse   . History of kidney stones   . Marijuana abuse     Past Surgical History:  Procedure Laterality Date  . CESAREAN SECTION    . CESAREAN SECTION     x2  . TOTAL KNEE ARTHROPLASTY Left 04/14/2017   Procedure: LEFT TOTAL KNEE ARTHROPLASTY;  Surgeon: Mcarthur Rossetti, MD;  Location: [facility];  Service: Orthopedics;  Laterality: Left;  . TUBAL LIGATION      There were no vitals filed for this visit.      Subjective Assessment - 05/18/17 1451    Subjective Gabby doing well.  Noted increased L knee soreness x 2 days following last treatment.   Currently in Pain? Yes   Pain Score 4    Pain Location Knee   Pain Orientation Left   Pain Descriptors / Indicators Throbbing;Constant   Pain Type Surgical pain            [facility] PT Assessment - 05/18/17 1517      AROM   AROM Assessment Site Knee   Right/Left Knee Left   Left Knee Flexion 76                     [facility] Adult PT Treatment/Exercise - 05/18/17 1508      Ambulation/Gait   Ambulation/Gait Yes   Ambulation/Gait Assistance 6: Modified independent (Device/Increase time)   Ambulation Distance  (Feet) 200 Feet   Assistive device Straight cane   Pre-Gait Activities Working on even weight shift and "butt kicks" to improve hip/knee flexion with swing phase at L      Knee/Hip Exercises: Stretches   Sports administrator Left;1 rep;60 seconds   Quad Stretch Limitations with strap    Gastroc Stretch 2 reps;Left;30 seconds   Gastroc Stretch Limitations prostretch and strap     Knee/Hip Exercises: Aerobic   Stationary Bike 6 min for ROM - partial revolutions     Knee/Hip Exercises: Standing   Functional Squat 15 reps;5 seconds   Functional Squat Limitations holding onto treadmill; flexion stretch at bottom; cues to prevent R weight shift     Knee/Hip Exercises: Supine   Straight Leg Raises Left;15 reps   Straight Leg Raises Limitations 1#; focusing on SLR without lag    Other Supine Knee/Hip Exercises B knee flexion stretch with heels one peanut p-ball 5" x 10 reps  Overpressure from therapist throughout      Vasopneumatic   Number Minutes Vasopneumatic  10 minutes   Vasopnuematic Location  Knee  L    Vasopneumatic Pressure Medium   Vasopneumatic  Temperature  coldest temp.      Manual Therapy   Manual Therapy Joint mobilization;Passive ROM   Manual therapy comments supine   Joint Mobilization L knee anterior/posterior mobs, patellar mobs all directions to improve ROM   Passive ROM L knee PROM flexion/extension with gentle stretch to improve ROM                  PT Short Term Goals - 05/12/17 1421      PT SHORT TERM GOAL #1   Title patient to be independent with initial HEP for stretching and strengthening (all STGs to be met by 06/03/17)   Status On-going     PT SHORT TERM GOAL #2   Title Patient to improve L knee AROM to 0-100 needed for improved gait mechanics   Status On-going           PT Long Term Goals - 05/12/17 1422      PT LONG TERM GOAL #1   Title patient to be independent with advanced HEP (all LTGs to be met by 07/01/17)   Status On-going     PT  LONG TERM GOAL #2   Title Patient to improve L knee AROM to 0-110 needed for improved gait mechanics and stair navigation wihtout compensation    Status On-going     PT LONG TERM GOAL #3   Title Patient to demonstrate L SLR without extensor lag   Status On-going     PT LONG TERM GOAL #4   Title Patient to demonstrate good heel toe gait mechanics over various surfaces with no AD and no evidence of instability   Status On-going               Plan - 05/18/17 1450    Clinical Impression Statement Pt. doing well today however noting LE soreness following last treatment.  Treatment focusing on improving L knee ROM through manual patellar mobs, joint mobs, gentle stretching, and review of home stretches.  Pt. able to improve L knee AROM flexion to 76 dg today and encouraged to consistently perform HEP stretches previously issued multiple times daily.  Pt. seems to be progressing well with improving quad control with SLR.     PT Treatment/Interventions ADLs/Self Care Home Management;Cryotherapy;Electrical Stimulation;Moist Heat;Ultrasound;Neuromuscular re-education;Balance training;Therapeutic exercise;Therapeutic activities;Functional mobility training;Stair training;Gait training;Patient/family education;Manual techniques;Scar mobilization;Passive range of motion;Vasopneumatic Device;Taping;Dry needling      Patient will benefit from skilled therapeutic intervention in order to improve the following deficits and impairments:  Abnormal gait, Decreased activity tolerance, Decreased balance, Decreased mobility, Decreased strength, Difficulty walking, Pain  Visit Diagnosis: Acute pain of left knee  Stiffness of left knee, not elsewhere classified  Difficulty in walking, not elsewhere classified  Other abnormalities of gait and mobility     Problem List Patient Active Problem List   Diagnosis Date Noted  . Status post total knee replacement, left 04/14/2017  . Unilateral primary  osteoarthritis, left knee 02/23/2017  . Left knee pain 12/15/2016    Bess Harvest, PTA 05/19/17 12:20 PM  [facility] [place] [address]  [place], [place], [address] Phone: [phone]   Fax:  [phone]  Name: Sarah Hanson MRN: [id number] Date of Birth: 1971/09/18

## 2017-05-20 ENCOUNTER — Ambulatory Visit: Payer: Self-pay | Attending: Orthopaedic Surgery | Admitting: Physical Therapy

## 2017-05-20 DIAGNOSIS — R262 Difficulty in walking, not elsewhere classified: Secondary | ICD-10-CM | POA: Insufficient documentation

## 2017-05-20 DIAGNOSIS — R2689 Other abnormalities of gait and mobility: Secondary | ICD-10-CM | POA: Insufficient documentation

## 2017-05-20 DIAGNOSIS — M25662 Stiffness of left knee, not elsewhere classified: Secondary | ICD-10-CM | POA: Insufficient documentation

## 2017-05-20 DIAGNOSIS — M25562 Pain in left knee: Secondary | ICD-10-CM | POA: Insufficient documentation

## 2017-05-21 ENCOUNTER — Telehealth (INDEPENDENT_AMBULATORY_CARE_PROVIDER_SITE_OTHER): Payer: Self-pay | Admitting: Orthopaedic Surgery

## 2017-05-21 ENCOUNTER — Other Ambulatory Visit (INDEPENDENT_AMBULATORY_CARE_PROVIDER_SITE_OTHER): Payer: Self-pay | Admitting: Orthopaedic Surgery

## 2017-05-21 MED ORDER — HYDROCODONE-ACETAMINOPHEN 5-325 MG PO TABS: 1.0000 | ORAL_TABLET | Freq: Four times a day (QID) | ORAL | 0 refills | Status: DC | PRN

## 2017-05-21 NOTE — Telephone Encounter (Signed)
Please advise 

## 2017-05-21 NOTE — Telephone Encounter (Signed)
PT REQUEST REFILL OF PAIN MEDS PLEASE  [phone]

## 2017-05-21 NOTE — Telephone Encounter (Signed)
She can come and pick up a prescription for hydrocodone at this standpoint.

## 2017-05-21 NOTE — Telephone Encounter (Signed)
Patient aware of the below message  

## 2017-05-22 ENCOUNTER — Ambulatory Visit: Payer: Self-pay

## 2017-05-22 DIAGNOSIS — R2689 Other abnormalities of gait and mobility: Secondary | ICD-10-CM

## 2017-05-22 DIAGNOSIS — M25562 Pain in left knee: Secondary | ICD-10-CM

## 2017-05-22 DIAGNOSIS — R262 Difficulty in walking, not elsewhere classified: Secondary | ICD-10-CM

## 2017-05-22 DIAGNOSIS — M25662 Stiffness of left knee, not elsewhere classified: Secondary | ICD-10-CM

## 2017-05-22 NOTE — Therapy (Signed)
[facility] [address]  [place], [place], [address] Phone: [phone]   Fax:  [phone]  Physical Therapy Treatment  Patient Details  Name: Sarah Hanson MRN: [id number] Date of Birth: 12/30/1970 Referring Provider: Dr. Jean Rosenthal  Encounter Date: 05/22/2017      PT End of Session - 05/22/17 1052    Visit Number 5   Number of Visits 24   Date for PT Re-Evaluation 07/01/17   Authorization Type [facility] [employer]   PT Start Time 6294   PT Stop Time 1115   PT Time Calculation (min) 60 min   Activity Tolerance Patient tolerated treatment well   Behavior During Therapy [facility] for tasks assessed/performed      Past Medical History:  Diagnosis Date  . Anxiety    no med at this time--pt quit due to $  . Arthritis   . ETOH abuse   . History of kidney stones   . Marijuana abuse     Past Surgical History:  Procedure Laterality Date  . CESAREAN SECTION    . CESAREAN SECTION     x2  . TOTAL KNEE ARTHROPLASTY Left 04/14/2017   Procedure: LEFT TOTAL KNEE ARTHROPLASTY;  Surgeon: Mcarthur Rossetti, MD;  Location: [facility];  Service: Orthopedics;  Laterality: Left;  . TUBAL LIGATION      There were no vitals filed for this visit.      Subjective Assessment - 05/22/17 1020    Subjective Pt. doing well noting no soreness following last visit.     Patient Stated Goals "just to be able to walk without pain"   Currently in Pain? Yes   Pain Score 4    Pain Location Knee   Pain Orientation Left   Pain Descriptors / Indicators Throbbing;Constant   Pain Type Surgical pain   Pain Frequency Constant   Multiple Pain Sites No            [facility] PT Assessment - 05/22/17 1027      Assessment   Next MD Visit 8.13.18                     [facility] Adult PT Treatment/Exercise - 05/22/17 1045      Knee/Hip Exercises: Aerobic   Stationary Bike 6 min for ROM - partial revolutions  constant cueing  for pt. to hold knee flexion stretch      Knee/Hip Exercises: Standing   Forward Lunges Right;Left;3 seconds;10 reps   Forward Lunges Limitations Heavy tactile cueing required   holding onto treadmill   Functional Squat 15 reps;5 seconds   Functional Squat Limitations TRX; mirror cues for wt. shift      Knee/Hip Exercises: Seated   Long Arc Quad Left;15 reps;Weights   Long Arc Quad Weight 3 lbs.   Long [employer] Limitations 5 sec hold; adduction ball squeeze      Vasopneumatic   Number Minutes Vasopneumatic  15 minutes   Vasopnuematic Location  Knee  L    Vasopneumatic Pressure Medium   Vasopneumatic Temperature  coldest temp.      Manual Therapy   Manual Therapy Joint mobilization;Passive ROM;Soft tissue mobilization   Manual therapy comments supine   Joint Mobilization L knee anterior/posterior mobs, patellar mobs all directions to improve ROM   Soft tissue mobilization Gentle scar massage x 2 min; to loosen possible adhesions along incision and improve tissue flexibilty to improved ROM   Passive ROM L knee PROM  flexion/extension with gentle stretch to improve ROM                  PT Short Term Goals - 05/12/17 1421      PT SHORT TERM GOAL #1   Title patient to be independent with initial HEP for stretching and strengthening (all STGs to be met by 06/03/17)   Status On-going     PT SHORT TERM GOAL #2   Title Patient to improve L knee AROM to 0-100 needed for improved gait mechanics   Status On-going           PT Long Term Goals - 05/12/17 1422      PT LONG TERM GOAL #1   Title patient to be independent with advanced HEP (all LTGs to be met by 07/01/17)   Status On-going     PT LONG TERM GOAL #2   Title Patient to improve L knee AROM to 0-110 needed for improved gait mechanics and stair navigation wihtout compensation    Status On-going     PT LONG TERM GOAL #3   Title Patient to demonstrate L SLR without extensor lag   Status On-going     PT LONG  TERM GOAL #4   Title Patient to demonstrate good heel toe gait mechanics over various surfaces with no AD and no evidence of instability   Status On-going               Plan - 05/22/17 1052    Clinical Impression Statement Sarah Hanson doing well noting knee has been feeling better.  Heavy manual therapy focus today to improved L knee flexion/extension ROM with scar massage and joint mobs.  Dynamic ROM/strengthening activities with squat and lunge today with pt. tolerating well.  Ice/compression to L knee to end treatment to decrease post-exercise swelling and pain.  L knee flexion ROM improving slowly and will continue ROM and strengthening focus per pt. tolerance in coming visits.   PT Treatment/Interventions ADLs/Self Care Home Management;Cryotherapy;Electrical Stimulation;Moist Heat;Ultrasound;Neuromuscular re-education;Balance training;Therapeutic exercise;Therapeutic activities;Functional mobility training;Stair training;Gait training;Patient/family education;Manual techniques;Scar mobilization;Passive range of motion;Vasopneumatic Device;Taping;Dry needling      Patient will benefit from skilled therapeutic intervention in order to improve the following deficits and impairments:  Abnormal gait, Decreased activity tolerance, Decreased balance, Decreased mobility, Decreased strength, Difficulty walking, Pain  Visit Diagnosis: Acute pain of left knee  Stiffness of left knee, not elsewhere classified  Difficulty in walking, not elsewhere classified  Other abnormalities of gait and mobility     Problem List Patient Active Problem List   Diagnosis Date Noted  . Status post total knee replacement, left 04/14/2017  . Unilateral primary osteoarthritis, left knee 02/23/2017  . Left knee pain 12/15/2016    Bess Harvest, PTA 05/22/17 12:11 PM  [facility] [address]  [place], [place], [address] Phone: [phone]   Fax:   [phone]  Name: Sarah Hanson MRN: [id number] Date of Birth: 1970-12-14

## 2017-05-25 ENCOUNTER — Ambulatory Visit: Payer: Self-pay

## 2017-05-25 DIAGNOSIS — M25562 Pain in left knee: Secondary | ICD-10-CM

## 2017-05-25 DIAGNOSIS — M25662 Stiffness of left knee, not elsewhere classified: Secondary | ICD-10-CM

## 2017-05-25 DIAGNOSIS — R262 Difficulty in walking, not elsewhere classified: Secondary | ICD-10-CM

## 2017-05-25 DIAGNOSIS — R2689 Other abnormalities of gait and mobility: Secondary | ICD-10-CM

## 2017-05-25 NOTE — Therapy (Signed)
[facility] [address]  [place], [place], [address] Phone: [phone]   Fax:  [phone]  Physical Therapy Treatment  Patient Details  Name: Sarah Hanson MRN: [id number] Date of Birth: 02-Jul-1971 Referring Provider: Dr. Jean Rosenthal  Encounter Date: 05/25/2017      PT End of Session - 05/25/17 1620    Visit Number 6   Number of Visits 24   Date for PT Re-Evaluation 07/01/17   Authorization Type [facility] [employer]   PT Start Time 1615   PT Stop Time 1700   PT Time Calculation (min) 45 min   Activity Tolerance Patient tolerated treatment well   Behavior During Therapy [facility] for tasks assessed/performed      Past Medical History:  Diagnosis Date  . Anxiety    no med at this time--pt quit due to $  . Arthritis   . ETOH abuse   . History of kidney stones   . Marijuana abuse     Past Surgical History:  Procedure Laterality Date  . CESAREAN SECTION    . CESAREAN SECTION     x2  . TOTAL KNEE ARTHROPLASTY Left 04/14/2017   Procedure: LEFT TOTAL KNEE ARTHROPLASTY;  Surgeon: Mcarthur Rossetti, MD;  Location: [facility];  Service: Orthopedics;  Laterality: Left;  . TUBAL LIGATION      There were no vitals filed for this visit.      Subjective Assessment - 05/25/17 1616    Subjective Pt. doing well however reports she broke rubber tip off cane.     Patient Stated Goals "just to be able to walk without pain"   Currently in Pain? Yes   Pain Score 2    Pain Location Knee   Pain Orientation Left   Pain Descriptors / Indicators Throbbing   Pain Type Surgical pain   Aggravating Factors  Mornings,    Pain Relieving Factors Ice,    Multiple Pain Sites No            [facility] PT Assessment - 05/25/17 1638      Assessment   Next MD Visit 8.20.18                     [facility] Adult PT Treatment/Exercise - 05/25/17 1636      Knee/Hip Exercises: Stretches   Gastroc Stretch Left;1 rep;60  seconds   Gastroc Stretch Limitations prostretch      Knee/Hip Exercises: Aerobic   Stationary Bike 6 min for ROM - partial revolutions     Knee/Hip Exercises: Standing   Heel Raises Both;20 reps;3 seconds  Wt. shift over L LE    Step Down Left;Hand Hold: 1;15 reps;Step Height: 4"  6" attempted however limited control    Step Down Limitations HH support from therapist    Functional Squat 15 reps;5 seconds   Functional Squat Limitations counter squat   heavy cueing to avoid R wt. shift   Other Standing Knee Exercises L knee lunge stretch into treadmill 5" x 10 reps      Knee/Hip Exercises: Supine   Straight Leg Raises Left;20 reps   Straight Leg Raises Limitations 2#; quad set prior to each movement     Manual Therapy   Manual Therapy Joint mobilization;Passive ROM;Soft tissue mobilization   Manual therapy comments supine   Joint Mobilization L knee anterior/posterior mobs, patellar mobs all directions to improve ROM   Soft tissue mobilization Gentle scar massage x 2 min  Passive ROM L knee PROM flexion/extension with gentle stretch to improve ROM                  PT Short Term Goals - 05/12/17 1421      PT SHORT TERM GOAL #1   Title patient to be independent with initial HEP for stretching and strengthening (all STGs to be met by 06/03/17)   Status On-going     PT SHORT TERM GOAL #2   Title Patient to improve L knee AROM to 0-100 needed for improved gait mechanics   Status On-going           PT Long Term Goals - 05/12/17 1422      PT LONG TERM GOAL #1   Title patient to be independent with advanced HEP (all LTGs to be met by 07/01/17)   Status On-going     PT LONG TERM GOAL #2   Title Patient to improve L knee AROM to 0-110 needed for improved gait mechanics and stair navigation wihtout compensation    Status On-going     PT LONG TERM GOAL #3   Title Patient to demonstrate L SLR without extensor lag   Status On-going     PT LONG TERM GOAL #4    Title Patient to demonstrate good heel toe gait mechanics over various surfaces with no AD and no evidence of instability   Status On-going               Plan - 05/25/17 1844    Clinical Impression Statement Pt. seen ambulating with SPC and broken cane tip thus pt. given tennis ball to pad SPC tip and provide grip.  Pt. requesting copy of previous HEP handouts as she "thinks" she lost them.  Heavy focus today on manual stretching, patellar mobs, and joint mobs to improved L knee ROM.  Pt. continues to demo slow progress with flexion ROM and limited quad control with SLR.  4" eccentric step down added today with pt. demonstrating very limited control.  Pt. continues to be willing to try all therex in treatment.  Pt. encouraged today to consistently perform all previous HEP daily for full benefit from therapy.  Still required heavy cueing for proper technique with squatting and step downs thus these activities not added to HEP.  Will update HEP with more dynamic strengthening activities as pt. able to demo proper technique.     PT Treatment/Interventions ADLs/Self Care Home Management;Cryotherapy;Electrical Stimulation;Moist Heat;Ultrasound;Neuromuscular re-education;Balance training;Therapeutic exercise;Therapeutic activities;Functional mobility training;Stair training;Gait training;Patient/family education;Manual techniques;Scar mobilization;Passive range of motion;Vasopneumatic Device;Taping;Dry needling      Patient will benefit from skilled therapeutic intervention in order to improve the following deficits and impairments:  Abnormal gait, Decreased activity tolerance, Decreased balance, Decreased mobility, Decreased strength, Difficulty walking, Pain  Visit Diagnosis: Acute pain of left knee  Stiffness of left knee, not elsewhere classified  Difficulty in walking, not elsewhere classified  Other abnormalities of gait and mobility     Problem List Patient Active Problem List    Diagnosis Date Noted  . Status post total knee replacement, left 04/14/2017  . Unilateral primary osteoarthritis, left knee 02/23/2017  . Left knee pain 12/15/2016    Bess Harvest, PTA 05/25/17 6:56 PM  [facility] [address]  [place], [place], [address] Phone: [phone]   Fax:  [phone]  Name: Sarah Hanson MRN: [id number] Date of Birth: 08-18-1971

## 2017-05-27 ENCOUNTER — Ambulatory Visit: Payer: Self-pay | Admitting: Physical Therapy

## 2017-05-28 ENCOUNTER — Other Ambulatory Visit (INDEPENDENT_AMBULATORY_CARE_PROVIDER_SITE_OTHER): Payer: Self-pay | Admitting: Physician Assistant

## 2017-05-28 ENCOUNTER — Telehealth (INDEPENDENT_AMBULATORY_CARE_PROVIDER_SITE_OTHER): Payer: Self-pay | Admitting: *Deleted

## 2017-05-28 ENCOUNTER — Telehealth (INDEPENDENT_AMBULATORY_CARE_PROVIDER_SITE_OTHER): Payer: Self-pay | Admitting: Orthopaedic Surgery

## 2017-05-28 MED ORDER — HYDROCODONE-ACETAMINOPHEN 5-325 MG PO TABS: 1.0000 | ORAL_TABLET | Freq: Four times a day (QID) | ORAL | 0 refills | Status: DC | PRN

## 2017-05-28 NOTE — Telephone Encounter (Signed)
Please advise 

## 2017-05-28 NOTE — Telephone Encounter (Signed)
Patient called asking for a refill on Hydrocodone, she is leaving to go out of town this weekend and wanted to know if there was any way possible she could get it before she leaves. Thank you. CB # [phone]

## 2017-05-28 NOTE — Telephone Encounter (Signed)
Patient aware Rx ready at front desk  

## 2017-05-28 NOTE — Telephone Encounter (Signed)
Prescription has been written and patient will be called

## 2017-05-28 NOTE — Telephone Encounter (Signed)
On your desk

## 2017-05-28 NOTE — Telephone Encounter (Signed)
Pt called for refill of hydrocodone

## 2017-05-29 ENCOUNTER — Ambulatory Visit: Payer: Self-pay | Admitting: Physical Therapy

## 2017-06-01 ENCOUNTER — Ambulatory Visit: Payer: Self-pay

## 2017-06-01 ENCOUNTER — Ambulatory Visit (INDEPENDENT_AMBULATORY_CARE_PROVIDER_SITE_OTHER): Payer: Self-pay | Admitting: Orthopaedic Surgery

## 2017-06-01 DIAGNOSIS — M25562 Pain in left knee: Secondary | ICD-10-CM

## 2017-06-01 DIAGNOSIS — M25662 Stiffness of left knee, not elsewhere classified: Secondary | ICD-10-CM

## 2017-06-01 DIAGNOSIS — R262 Difficulty in walking, not elsewhere classified: Secondary | ICD-10-CM

## 2017-06-01 DIAGNOSIS — R2689 Other abnormalities of gait and mobility: Secondary | ICD-10-CM

## 2017-06-01 NOTE — Therapy (Signed)
[facility] [address]  [place], [place], [address] Phone: [phone]   Fax:  [phone]  Physical Therapy Treatment  Patient Details  Name: Sarah Hanson MRN: [id number] Date of Birth: 10-05-1971 Referring Provider: Dr. Jean Rosenthal  Encounter Date: 06/01/2017      PT End of Session - 06/01/17 1437    Visit Number 7   Number of Visits 24   Date for PT Re-Evaluation 07/01/17   Authorization Type [facility] [employer]   PT Start Time 1409  pt. arrived late    PT Stop Time 1445   PT Time Calculation (min) 36 min   Activity Tolerance Patient tolerated treatment well   Behavior During Therapy [facility] for tasks assessed/performed      Past Medical History:  Diagnosis Date  . Anxiety    no med at this time--pt quit due to $  . Arthritis   . ETOH abuse   . History of kidney stones   . Marijuana abuse     Past Surgical History:  Procedure Laterality Date  . CESAREAN SECTION    . CESAREAN SECTION     x2  . TOTAL KNEE ARTHROPLASTY Left 04/14/2017   Procedure: LEFT TOTAL KNEE ARTHROPLASTY;  Surgeon: Mcarthur Rossetti, MD;  Location: [facility];  Service: Orthopedics;  Laterality: Left;  . TUBAL LIGATION      There were no vitals filed for this visit.      Subjective Assessment - 06/01/17 1414    Subjective Sarah Hanson doing well.  Was having trouble finding ride to therapy this past week.     Patient Stated Goals "just to be able to walk without pain"   Currently in Pain? Yes   Pain Score 1    Pain Location Knee   Pain Orientation Left   Pain Descriptors / Indicators Sore   Pain Type Surgical pain   Pain Onset More than a month ago   Pain Frequency Constant   Aggravating Factors  mornings, getting out of truck   Pain Relieving Factors ice, ibuprofen    Multiple Pain Sites No            [facility] PT Assessment - 06/01/17 1432      AROM   AROM Assessment Site Knee   Right/Left Knee Left   Left  Knee Extension 3   Left Knee Flexion 80     PROM   Right/Left Knee Left   Left Knee Extension 3   Left Knee Flexion 85                     [facility] Adult PT Treatment/Exercise - 06/01/17 1438      Knee/Hip Exercises: Aerobic   Stationary Bike 6 min for ROM - partial revolutions     Knee/Hip Exercises: Standing   Functional Squat 15 reps;5 seconds   Functional Squat Limitations mirror training for even wt. shift   Other Standing Knee Exercises L knee lunge stretch into treadmill 5" x 10 reps      Manual Therapy   Manual Therapy Joint mobilization;Passive ROM;Soft tissue mobilization   Manual therapy comments supine   Joint Mobilization L knee anterior/posterior mobs, patellar mobs all directions to improve ROM   Soft tissue mobilization Gentle scar massage x 2 min    Passive ROM L knee PROM flexion/extension with gentle stretch to improve ROM   Mus[employer] Contract/relax L quad stretch with therapist to improve RF flexibility  PT Short Term Goals - 05/12/17 1421      PT SHORT TERM GOAL #1   Title patient to be independent with initial HEP for stretching and strengthening (all STGs to be met by 06/03/17)   Status On-going     PT SHORT TERM GOAL #2   Title Patient to improve L knee AROM to 0-100 needed for improved gait mechanics   Status On-going           PT Long Term Goals - 05/12/17 1422      PT LONG TERM GOAL #1   Title patient to be independent with advanced HEP (all LTGs to be met by 07/01/17)   Status On-going     PT LONG TERM GOAL #2   Title Patient to improve L knee AROM to 0-110 needed for improved gait mechanics and stair navigation wihtout compensation    Status On-going     PT LONG TERM GOAL #3   Title Patient to demonstrate L SLR without extensor lag   Status On-going     PT LONG TERM GOAL #4   Title Patient to demonstrate good heel toe gait mechanics over various surfaces with no AD and no evidence  of instability   Status On-going               Plan - 06/01/17 1452    Clinical Impression Statement Pt. arriving late to therapy thus treatment time limited.  Able to improve L knee AROM 3-80 dg today.  PROM 3-85 dg.  Pt. admitting to poor HEP adherence today.  Strongly instructed to perform HEP stretches 2x/day for full benefit from therapy.  Focused on manual work to L knee to improve ROM today with remaining treatment time spent on strengthening activities.  Will progress per pt. in coming visits.  Pt. declining ice to end treatment reporting plans to ice at home.   PT Treatment/Interventions ADLs/Self Care Home Management;Cryotherapy;Electrical Stimulation;Moist Heat;Ultrasound;Neuromuscular re-education;Balance training;Therapeutic exercise;Therapeutic activities;Functional mobility training;Stair training;Gait training;Patient/family education;Manual techniques;Scar mobilization;Passive range of motion;Vasopneumatic Device;Taping;Dry needling      Patient will benefit from skilled therapeutic intervention in order to improve the following deficits and impairments:  Abnormal gait, Decreased activity tolerance, Decreased balance, Decreased mobility, Decreased strength, Difficulty walking, Pain  Visit Diagnosis: Acute pain of left knee  Stiffness of left knee, not elsewhere classified  Difficulty in walking, not elsewhere classified  Other abnormalities of gait and mobility     Problem List Patient Active Problem List   Diagnosis Date Noted  . Status post total knee replacement, left 04/14/2017  . Unilateral primary osteoarthritis, left knee 02/23/2017  . Left knee pain 12/15/2016    Bess Harvest, PTA 06/01/17 5:46 PM  [facility] [address]  [place], [place], [address] Phone: [phone]   Fax:  [phone]  Name: Sarah Hanson MRN: [id number] Date of Birth: 1971/10/17

## 2017-06-03 ENCOUNTER — Ambulatory Visit: Payer: Self-pay | Admitting: Physical Therapy

## 2017-06-03 DIAGNOSIS — M25562 Pain in left knee: Secondary | ICD-10-CM

## 2017-06-03 DIAGNOSIS — R2689 Other abnormalities of gait and mobility: Secondary | ICD-10-CM

## 2017-06-03 DIAGNOSIS — R262 Difficulty in walking, not elsewhere classified: Secondary | ICD-10-CM

## 2017-06-03 DIAGNOSIS — M25662 Stiffness of left knee, not elsewhere classified: Secondary | ICD-10-CM

## 2017-06-03 NOTE — Therapy (Signed)
[facility] [address]  [address] [place], [place], [address] Phone: [phone]   Fax:  [phone]  Physical Therapy Treatment  Patient Details  Name: Sarah Hanson MRN: [id number] Date of Birth: [birth year] Referring Provider: Dr. Doneen Poisson  Encounter Date: 06/03/2017      PT End of Session - 06/03/17 1320    Visit Number 8   Number of Visits 24   Date for PT Re-Evaluation 07/01/17   Authorization Type [facility] [employer]   PT Start Time 1318   PT Stop Time 1405   PT Time Calculation (min) 47 min   Activity Tolerance Patient tolerated treatment well   Behavior During Therapy [facility] for tasks assessed/performed      Past Medical History:  Diagnosis Date  . Anxiety    no med at this time--pt quit due to $  . Arthritis   . ETOH abuse   . History of kidney stones   . Marijuana abuse     Past Surgical History:  Procedure Laterality Date  . CESAREAN SECTION    . CESAREAN SECTION     x2  . TOTAL KNEE ARTHROPLASTY Left 04/14/2017   Procedure: LEFT TOTAL KNEE ARTHROPLASTY;  Surgeon: Kathryne Hitch, MD;  Location[facility];  Service: Orthopedics;  Laterality: Left;  . TUBAL LIGATION      There were no vitals filed for this visit.      Subjective Assessment - 06/03/17 1318    Subjective still using cane; wants to speed up process   Patient Stated Goals "just to be able to walk without pain"   Currently in Pain? Yes   Pain Score 2    Pain Location Knee   Pain Orientation Left   Pain Descriptors / Indicators Aching;Tightness   Pain Type Surgical pain                         [facility] Adult PT Treatment/Exercise - 06/03/17 1332      Knee/Hip Exercises: Aerobic   Stationary Bike 6 min for ROM - partial revolutions   Elliptical 3 min - to encourage hip knee flexion for carryover into gait     Knee/Hip Exercises: Machines for Strengthening   Cybex Knee Extension 15# B con/L ecc 2 x  15   Cybex Knee Flexion 15# B con/L ecc 2 x 15   Cybex Leg Press 25# BLE x 15; 25# B con/L ecc x 15     Knee/Hip Exercises: Standing   Lateral Step Up Left;15 reps;Hand Hold: 1;Step Height: 6"   Step Down Left;15 reps;Hand Hold: 2;Step Height: 4"   Step Down Limitations eccentric   Wall Squat 15 reps;5 seconds   Wall Squat Limitations VC for equal weight shift                  PT Short Term Goals - 05/12/17 1421      PT SHORT TERM GOAL #1   Title patient to be independent with initial HEP for stretching and strengthening (all STGs to be met by 06/03/17)   Status On-going     PT SHORT TERM GOAL #2   Title Patient to improve L knee AROM to 0-100 needed for improved gait mechanics   Status On-going           PT Long Term Goals - 05/12/17 1422      PT LONG TERM GOAL #1   Title patient to  be independent with advanced HEP (all LTGs to be met by 07/01/17)   Status On-going     PT LONG TERM GOAL #2   Title Patient to improve L knee AROM to 0-110 needed for improved gait mechanics and stair navigation wihtout compensation    Status On-going     PT LONG TERM GOAL #3   Title Patient to demonstrate L SLR without extensor lag   Status On-going     PT LONG TERM GOAL #4   Title Patient to demonstrate good heel toe gait mechanics over various surfaces with no AD and no evidence of instability   Status On-going               Plan - 06/03/17 1403    Clinical Impression Statement Patient continues with heavy ROM and strength deficits - unsure of true compliance with HEP and general daily stretching. PT session today focusing heavily on gait mechaincs as well as functional strength for quadricep and HS muscle group with good tolerance. Much difficulty noted with eccentric work both with machine and during weight bearing. Will continue to progress ROM and strength at upcoming visits.    PT Treatment/Interventions ADLs/Self Care Home Management;Cryotherapy;Electrical  Stimulation;Moist Heat;Ultrasound;Neuromuscular re-education;Balance training;Therapeutic exercise;Therapeutic activities;Functional mobility training;Stair training;Gait training;Patient/family education;Manual techniques;Scar mobilization;Passive range of motion;Vasopneumatic Device;Taping;Dry needling   Consulted and Agree with Plan of Care Patient      Patient will benefit from skilled therapeutic intervention in order to improve the following deficits and impairments:  Abnormal gait, Decreased activity tolerance, Decreased balance, Decreased mobility, Decreased strength, Difficulty walking, Pain  Visit Diagnosis: Acute pain of left knee  Stiffness of left knee, not elsewhere classified  Difficulty in walking, not elsewhere classified  Other abnormalities of gait and mobility     Problem List Patient Active Problem List   Diagnosis Date Noted  . Status post total knee replacement, left 04/14/2017  . Unilateral primary osteoarthritis, left knee 02/23/2017  . Left knee pain 12/15/2016      Lanney Gins, PT, DPT 06/03/17 2:06 PM   [facility] [address]  [place], [place], [address] Phone: [phone]   Fax:  [phone]  Name: Sarah Hanson MRN: [id number] Date of Birth: 09/01/1971

## 2017-06-03 NOTE — Patient Instructions (Signed)
Therapeutic - Strengthening Knees - Wall Slide   With back pressed against wall, slide down until knees are bent. Hold __5-10_ seconds. Repeat __15__ times.   Knee Extension: Step-Down Forward / Sideways / Backward (Eccentric)   Stand, holding support, affected foot on step. Slowly bend affected knee for 3-5 seconds and bring other heel forward to floor. Quickly straighten affected leg. Repeat, placing foot flat to side. Repeat, touching toe behind. _15__ reps per set, _2__ sets per day

## 2017-06-05 ENCOUNTER — Ambulatory Visit: Payer: Self-pay | Admitting: Physical Therapy

## 2017-06-05 DIAGNOSIS — M25562 Pain in left knee: Secondary | ICD-10-CM

## 2017-06-05 DIAGNOSIS — M25662 Stiffness of left knee, not elsewhere classified: Secondary | ICD-10-CM

## 2017-06-05 DIAGNOSIS — R262 Difficulty in walking, not elsewhere classified: Secondary | ICD-10-CM

## 2017-06-05 DIAGNOSIS — R2689 Other abnormalities of gait and mobility: Secondary | ICD-10-CM

## 2017-06-05 NOTE — Therapy (Signed)
[facility] [address]  [place], [place], [address] Phone: [phone]   Fax:  [phone]  Physical Therapy Treatment  Patient Details  Name: Sarah Hanson MRN: [id number] Date of Birth: 05-31-1971 Referring Provider: Dr. Jean Rosenthal  Encounter Date: 06/05/2017      PT End of Session - 06/05/17 1110    Visit Number 9   Number of Visits 24   Date for PT Re-Evaluation 07/01/17   Authorization Type [facility] [employer]   PT Start Time 1058   PT Stop Time 1157   PT Time Calculation (min) 59 min   Activity Tolerance Patient tolerated treatment well   Behavior During Therapy [facility] for tasks assessed/performed      Past Medical History:  Diagnosis Date  . Anxiety    no med at this time--pt quit due to $  . Arthritis   . ETOH abuse   . History of kidney stones   . Marijuana abuse     Past Surgical History:  Procedure Laterality Date  . CESAREAN SECTION    . CESAREAN SECTION     x2  . TOTAL KNEE ARTHROPLASTY Left 04/14/2017   Procedure: LEFT TOTAL KNEE ARTHROPLASTY;  Surgeon: Mcarthur Rossetti, MD;  Location: [facility];  Service: Orthopedics;  Laterality: Left;  . TUBAL LIGATION      There were no vitals filed for this visit.      Subjective Assessment - 06/05/17 1106    Subjective wants to start applying for jobs - doesn't know if she can work   Patient Stated Goals "just to be able to walk without pain"   Currently in Pain? Yes   Pain Score 2    Pain Location Knee   Pain Orientation Left   Pain Descriptors / Indicators Aching;Tightness   Pain Type Surgical pain            [facility] PT Assessment - 06/05/17 0001      AROM   AROM Assessment Site Knee   Right/Left Knee Left   Left Knee Extension 2   Left Knee Flexion 83                     [facility] Adult PT Treatment/Exercise - 06/05/17 1112      Knee/Hip Exercises: Stretches   Quad Stretch Left;3 reps;60 seconds   Quad  Stretch Limitations prone with strap     Knee/Hip Exercises: Aerobic   Elliptical L1 x 5 min - compensatory hip hike     Knee/Hip Exercises: Machines for Strengthening   Cybex Leg Press 25# B LE x 5; 25# L LE x 15     Knee/Hip Exercises: Standing   Forward Step Up Left;15 reps;Hand Hold: 1;Step Height: 8"   Functional Squat 15 reps   Functional Squat Limitations TRX   Wall Squat 15 reps;5 seconds   Wall Squat Limitations VC for equal weight shift   Other Standing Knee Exercises toe clears to 9" stool - 4# at ankle x 15 reps     Knee/Hip Exercises: Supine   Heel Slides Left;5 reps     Vasopneumatic   Number Minutes Vasopneumatic  15 minutes   Vasopnuematic Location  Knee   Vasopneumatic Pressure High   Vasopneumatic Temperature  coldest temp.                   PT Short Term Goals - 05/12/17 1421      PT SHORT  TERM GOAL #1   Title patient to be independent with initial HEP for stretching and strengthening (all STGs to be met by 06/03/17)   Status On-going     PT SHORT TERM GOAL #2   Title Patient to improve L knee AROM to 0-100 needed for improved gait mechanics   Status On-going           PT Long Term Goals - 05/12/17 1422      PT LONG TERM GOAL #1   Title patient to be independent with advanced HEP (all LTGs to be met by 07/01/17)   Status On-going     PT LONG TERM GOAL #2   Title Patient to improve L knee AROM to 0-110 needed for improved gait mechanics and stair navigation wihtout compensation    Status On-going     PT LONG TERM GOAL #3   Title Patient to demonstrate L SLR without extensor lag   Status On-going     PT LONG TERM GOAL #4   Title Patient to demonstrate good heel toe gait mechanics over various surfaces with no AD and no evidence of instability   Status On-going               Plan - 06/05/17 1112    Clinical Impression Statement Patient making slow progress with PT - with ROM as well as gait with fair carryover from visit to  visit. Unsure of true adherence to HEP for stretching, gait, and strengthening as patient continues to require consistent VC for stretching and walking patterns. Patient does continue to ambulate with SPC with tendency for circumduction rather than to increase hip and knee flexion. WIll continue to progress funtional mobility.    PT Treatment/Interventions ADLs/Self Care Home Management;Cryotherapy;Electrical Stimulation;Moist Heat;Ultrasound;Neuromuscular re-education;Balance training;Therapeutic exercise;Therapeutic activities;Functional mobility training;Stair training;Gait training;Patient/family education;Manual techniques;Scar mobilization;Passive range of motion;Vasopneumatic Device;Taping;Dry needling   Consulted and Agree with Plan of Care Patient      Patient will benefit from skilled therapeutic intervention in order to improve the following deficits and impairments:  Abnormal gait, Decreased activity tolerance, Decreased balance, Decreased mobility, Decreased strength, Difficulty walking, Pain  Visit Diagnosis: Acute pain of left knee  Stiffness of left knee, not elsewhere classified  Difficulty in walking, not elsewhere classified  Other abnormalities of gait and mobility     Problem List Patient Active Problem List   Diagnosis Date Noted  . Status post total knee replacement, left 04/14/2017  . Unilateral primary osteoarthritis, left knee 02/23/2017  . Left knee pain 12/15/2016    Lanney Gins, PT, DPT 06/05/17 11:57 AM   [facility] [address]  [place], [place], [address] Phone: [phone]   Fax:  [phone]  Name: Sarah Hanson MRN: [id number] Date of Birth: 09/23/1971

## 2017-06-08 ENCOUNTER — Ambulatory Visit (INDEPENDENT_AMBULATORY_CARE_PROVIDER_SITE_OTHER): Payer: Self-pay | Admitting: Orthopaedic Surgery

## 2017-06-08 DIAGNOSIS — Z96652 Presence of left artificial knee joint: Secondary | ICD-10-CM

## 2017-06-08 MED ORDER — HYDROCODONE-ACETAMINOPHEN 5-325 MG PO TABS: 1.0000 | ORAL_TABLET | Freq: Three times a day (TID) | ORAL | 0 refills | Status: DC | PRN

## 2017-06-08 NOTE — Progress Notes (Signed)
The patient will be 8 weeks tomorrow status post a left total knee arthroplasty. She is in outpatient therapy now. She is able with a cane. [place] exam her extension is almost full but her flexion is only to about 85.  I talked her about the possibility of a manipulation of her left knee under anesthesia. I described what this means and with the processes. I will refill her pain medicine today. She would like me to give at least 2 more weeks I'll see her back in 2 weeks and then she's not getting past 90 we will need to consider manipulation under anesthesia. She understands that she states.

## 2017-06-09 ENCOUNTER — Ambulatory Visit: Payer: Self-pay

## 2017-06-11 ENCOUNTER — Ambulatory Visit: Payer: Self-pay | Admitting: Physical Therapy

## 2017-06-11 DIAGNOSIS — M25562 Pain in left knee: Secondary | ICD-10-CM

## 2017-06-11 DIAGNOSIS — R2689 Other abnormalities of gait and mobility: Secondary | ICD-10-CM

## 2017-06-11 DIAGNOSIS — R262 Difficulty in walking, not elsewhere classified: Secondary | ICD-10-CM

## 2017-06-11 DIAGNOSIS — M25662 Stiffness of left knee, not elsewhere classified: Secondary | ICD-10-CM

## 2017-06-11 NOTE — Therapy (Addendum)
[facility] [address]  [place], [place], [address] Phone: [phone]   Fax:  [phone]  Physical Therapy Treatment  Patient Details  Name: Sarah Hanson MRN: [id number] Date of Birth: 07-13-1971 Referring Provider: Dr. Jean Rosenthal  Encounter Date: 06/11/2017      PT End of Session - 06/11/17 1317    Visit Number 10   Number of Visits 24   Date for PT Re-Evaluation 07/01/17   Authorization Type [facility] [employer]   PT Start Time 9794   PT Stop Time 1400   PT Time Calculation (min) 46 min   Activity Tolerance Patient tolerated treatment well   Behavior During Therapy [facility] for tasks assessed/performed      Past Medical History:  Diagnosis Date  . Anxiety    no med at this time--pt quit due to $  . Arthritis   . ETOH abuse   . History of kidney stones   . Marijuana abuse     Past Surgical History:  Procedure Laterality Date  . CESAREAN SECTION    . CESAREAN SECTION     x2  . TOTAL KNEE ARTHROPLASTY Left 04/14/2017   Procedure: LEFT TOTAL KNEE ARTHROPLASTY;  Surgeon: Mcarthur Rossetti, MD;  Location: [facility];  Service: Orthopedics;  Laterality: Left;  . TUBAL LIGATION      There were no vitals filed for this visit.      Subjective Assessment - 06/11/17 1316    Subjective MD concerned regarding ROM   Patient Stated Goals "just to be able to walk without pain"   Currently in Pain? Yes   Pain Score 2    Pain Location Knee   Pain Orientation Left   Pain Descriptors / Indicators Aching;Tightness   Pain Type Surgical pain                         [facility] Adult PT Treatment/Exercise - 06/11/17 0001      Knee/Hip Exercises: Stretches   Sports administrator Left;5 reps;60 seconds   Quad Stretch Limitations prone with strap   Other Knee/Hip Stretches quadruped - pushing buttock to heels - 3 x 45 seconds   Other Knee/Hip Stretches foam rolling to B quad x 2 min     Knee/Hip  Exercises: Aerobic   Elliptical L2 x 5 min     Knee/Hip Exercises: Machines for Strengthening   Cybex Leg Press 25# B con/L ecc x 15     Knee/Hip Exercises: Standing   Wall Squat 15 reps;5 seconds   Other Standing Knee Exercises toe clears to 9" stool - 3# at ankle x 15 reps - much improved hip/knee flexion when not focused on task                  PT Short Term Goals - 05/12/17 1421      PT SHORT TERM GOAL #1   Title patient to be independent with initial HEP for stretching and strengthening (all STGs to be met by 06/03/17)   Status On-going     PT SHORT TERM GOAL #2   Title Patient to improve L knee AROM to 0-100 needed for improved gait mechanics   Status On-going           PT Long Term Goals - 05/12/17 1422      PT LONG TERM GOAL #1   Title patient to be independent with advanced HEP (all LTGs to be  met by 07/01/17)   Status On-going     PT LONG TERM GOAL #2   Title Patient to improve L knee AROM to 0-110 needed for improved gait mechanics and stair navigation wihtout compensation    Status On-going     PT LONG TERM GOAL #3   Title Patient to demonstrate L SLR without extensor lag   Status On-going     PT LONG TERM GOAL #4   Title Patient to demonstrate good heel toe gait mechanics over various surfaces with no AD and no evidence of instability   Status On-going               Plan - 06/11/17 1318    Clinical Impression Statement Chart review revealing MD concerned over current ROM status - had discussion with patient regarding manipulation under anesthesia, but allowing 2 weeks to check on progress. PT session today focusing on stretching into knee flexion and quad based strengthening activities with knee pain and stiffness limiting.    PT Treatment/Interventions ADLs/Self Care Home Management;Cryotherapy;Electrical Stimulation;Moist Heat;Ultrasound;Neuromuscular re-education;Balance training;Therapeutic exercise;Therapeutic activities;Functional  mobility training;Stair training;Gait training;Patient/family education;Manual techniques;Scar mobilization;Passive range of motion;Vasopneumatic Device;Taping;Dry needling   Consulted and Agree with Plan of Care Patient      Patient will benefit from skilled therapeutic intervention in order to improve the following deficits and impairments:  Abnormal gait, Decreased activity tolerance, Decreased balance, Decreased mobility, Decreased strength, Difficulty walking, Pain  Visit Diagnosis: Acute pain of left knee  Stiffness of left knee, not elsewhere classified  Difficulty in walking, not elsewhere classified  Other abnormalities of gait and mobility     Problem List Patient Active Problem List   Diagnosis Date Noted  . Status post total knee replacement, left 04/14/2017  . Unilateral primary osteoarthritis, left knee 02/23/2017  . Left knee pain 12/15/2016     Lanney Gins, PT, DPT 06/11/17 2:04 PM  PHYSICAL THERAPY DISCHARGE SUMMARY  Visits from Start of Care: 10  Current functional level related to goals / functional outcomes: See above   Remaining deficits: See above; ROM, strength, and gait deficits limited due to pain   Education / Equipment: HEP  Plan: Patient agrees to discharge.  Patient goals were not met. Patient is being discharged due to not returning since the last visit.  ?????    Per chart review, patient undergoing manipulation under anesthesia. Patient not returning to PT following this.  Lanney Gins, PT, DPT 08/18/17 8:30 AM   [facility] [address]  [place], [place], [address] Phone: [phone]   Fax:  [phone]  Name: Sarah Hanson MRN: [id number] Date of Birth: 1970/11/25

## 2017-06-12 ENCOUNTER — Ambulatory Visit: Payer: Self-pay

## 2017-06-15 ENCOUNTER — Ambulatory Visit: Payer: Self-pay

## 2017-06-18 ENCOUNTER — Telehealth (INDEPENDENT_AMBULATORY_CARE_PROVIDER_SITE_OTHER): Payer: Self-pay

## 2017-06-18 ENCOUNTER — Other Ambulatory Visit (INDEPENDENT_AMBULATORY_CARE_PROVIDER_SITE_OTHER): Payer: Self-pay | Admitting: Orthopaedic Surgery

## 2017-06-18 MED ORDER — HYDROCODONE-ACETAMINOPHEN 5-325 MG PO TABS: 1.0000 | ORAL_TABLET | Freq: Two times a day (BID) | ORAL | 0 refills | Status: DC | PRN

## 2017-06-18 NOTE — Telephone Encounter (Signed)
She can come to the office to pick up another prescription for hydrocodone.

## 2017-06-18 NOTE — Telephone Encounter (Signed)
Please advise 

## 2017-06-18 NOTE — Telephone Encounter (Signed)
Patient would like a Rx refill on Hydrocodone.  Cb# is [phone].  Please advise.  Thank You.

## 2017-06-18 NOTE — Telephone Encounter (Signed)
Pt aware rx ready at front desk and to start weaning off

## 2017-06-23 ENCOUNTER — Telehealth (INDEPENDENT_AMBULATORY_CARE_PROVIDER_SITE_OTHER): Payer: Self-pay

## 2017-06-23 NOTE — Telephone Encounter (Signed)
Patient called and would like to schedule surgery for knee manipulation. She would like to schedule this ASAP. Please call [phone]

## 2017-06-23 NOTE — Telephone Encounter (Signed)
I fine with this being set up for her. She needs to understand though that this is not something where she stays overnight and that the procedure only takes about 15 minutes. She will need to have someone take her and weight on her as well as then take her home that same day. This has to be done at one of the main hospital settings as well given her lack of funds.

## 2017-06-23 NOTE — Telephone Encounter (Signed)
See message f rom patient

## 2017-06-24 ENCOUNTER — Ambulatory Visit (INDEPENDENT_AMBULATORY_CARE_PROVIDER_SITE_OTHER): Payer: Self-pay | Admitting: Orthopaedic Surgery

## 2017-06-25 ENCOUNTER — Telehealth (INDEPENDENT_AMBULATORY_CARE_PROVIDER_SITE_OTHER): Payer: Self-pay | Admitting: Orthopaedic Surgery

## 2017-06-25 NOTE — Telephone Encounter (Signed)
Patient calling trying to get ahold of you to schedule surgery

## 2017-06-25 NOTE — Telephone Encounter (Signed)
Patient had a question about a procedure with Dr. Magnus IvanBlackman. If you could give her a call back. Thank you. CB # [phone]

## 2017-06-29 ENCOUNTER — Telehealth (INDEPENDENT_AMBULATORY_CARE_PROVIDER_SITE_OTHER): Payer: Self-pay | Admitting: Orthopaedic Surgery

## 2017-06-29 ENCOUNTER — Other Ambulatory Visit (INDEPENDENT_AMBULATORY_CARE_PROVIDER_SITE_OTHER): Payer: Self-pay | Admitting: Orthopaedic Surgery

## 2017-06-29 MED ORDER — HYDROCODONE-ACETAMINOPHEN 5-325 MG PO TABS: 1.0000 | ORAL_TABLET | Freq: Two times a day (BID) | ORAL | 0 refills | Status: DC | PRN

## 2017-06-29 NOTE — Telephone Encounter (Signed)
Patient called asking for a refill on hydrocodone. CB # [phone]

## 2017-06-29 NOTE — Telephone Encounter (Signed)
I tried calling to advise could pick up.

## 2017-06-29 NOTE — Telephone Encounter (Signed)
I called patient and scheduled surgery. 

## 2017-06-29 NOTE — Telephone Encounter (Signed)
She can comment pick up a prescription.

## 2017-06-29 NOTE — Telephone Encounter (Signed)
Please advise 

## 2017-07-01 ENCOUNTER — Encounter (HOSPITAL_COMMUNITY): Payer: Self-pay | Admitting: *Deleted

## 2017-07-01 NOTE — Progress Notes (Signed)
Spoke with pt for pre-op orders. Pt denies cardiac history, chest pain or sob. Pt states she is not diabetic. Instructed pt no smoking as of now prior to surgery. Pt voiced understanding.

## 2017-07-02 ENCOUNTER — Ambulatory Visit (HOSPITAL_COMMUNITY)
Admission: RE | Admit: 2017-07-02 | Discharge: 2017-07-02 | Disposition: A | Discharge status: Home / Self Care | Payer: Self-pay | Source: Ambulatory Visit | Attending: Orthopaedic Surgery | Admitting: Orthopaedic Surgery

## 2017-07-02 ENCOUNTER — Encounter (HOSPITAL_COMMUNITY)
Admission: RE | Disposition: A | Discharge status: Home / Self Care | Payer: Self-pay | Source: Ambulatory Visit | Attending: Orthopaedic Surgery

## 2017-07-02 ENCOUNTER — Ambulatory Visit (HOSPITAL_COMMUNITY): Payer: Self-pay | Admitting: Anesthesiology

## 2017-07-02 ENCOUNTER — Encounter (HOSPITAL_COMMUNITY): Payer: Self-pay | Admitting: *Deleted

## 2017-07-02 DIAGNOSIS — Z87442 Personal history of urinary calculi: Secondary | ICD-10-CM | POA: Insufficient documentation

## 2017-07-02 DIAGNOSIS — T8482XA Fibrosis due to internal orthopedic prosthetic devices, implants and grafts, initial encounter: Secondary | ICD-10-CM

## 2017-07-02 DIAGNOSIS — F319 Bipolar disorder, unspecified: Secondary | ICD-10-CM | POA: Insufficient documentation

## 2017-07-02 DIAGNOSIS — M24662 Ankylosis, left knee: Secondary | ICD-10-CM | POA: Insufficient documentation

## 2017-07-02 DIAGNOSIS — Z96652 Presence of left artificial knee joint: Secondary | ICD-10-CM | POA: Insufficient documentation

## 2017-07-02 DIAGNOSIS — Z6834 Body mass index (BMI) 34.0-34.9, adult: Secondary | ICD-10-CM | POA: Insufficient documentation

## 2017-07-02 DIAGNOSIS — F17210 Nicotine dependence, cigarettes, uncomplicated: Secondary | ICD-10-CM | POA: Insufficient documentation

## 2017-07-02 DIAGNOSIS — F419 Anxiety disorder, unspecified: Secondary | ICD-10-CM | POA: Insufficient documentation

## 2017-07-02 DIAGNOSIS — M1990 Unspecified osteoarthritis, unspecified site: Secondary | ICD-10-CM | POA: Insufficient documentation

## 2017-07-02 DIAGNOSIS — Z79899 Other long term (current) drug therapy: Secondary | ICD-10-CM | POA: Insufficient documentation

## 2017-07-02 DIAGNOSIS — F1210 Cannabis abuse, uncomplicated: Secondary | ICD-10-CM | POA: Insufficient documentation

## 2017-07-02 DIAGNOSIS — E669 Obesity, unspecified: Secondary | ICD-10-CM | POA: Insufficient documentation

## 2017-07-02 DIAGNOSIS — Z7982 Long term (current) use of aspirin: Secondary | ICD-10-CM | POA: Insufficient documentation

## 2017-07-02 HISTORY — PX: KNEE CLOSED REDUCTION: SHX995

## 2017-07-02 HISTORY — DX: Bipolar disorder, unspecified: F31.9

## 2017-07-02 LAB — COMPREHENSIVE METABOLIC PANEL
ALK PHOS: 63 U/L (ref 38–126)
ALT: 36 U/L (ref 14–54)
ANION GAP: 8 (ref 5–15)
AST: 28 U/L (ref 15–41)
Albumin: 3.6 g/dL (ref 3.5–5.0)
BUN: 8 mg/dL (ref 6–20)
CALCIUM: 8.7 mg/dL — AB (ref 8.9–10.3)
CHLORIDE: 102 mmol/L (ref 101–111)
CO2: 24 mmol/L (ref 22–32)
CREATININE: 0.64 mg/dL (ref 0.44–1.00)
GFR calc Af Amer: >60 mL/min (ref >60)
Glucose, Bld: 110 mg/dL — ABNORMAL HIGH (ref 65–99)
Potassium: 3.6 mmol/L (ref 3.5–5.1)
Sodium: 134 mmol/L — ABNORMAL LOW (ref 135–145)
Total Bilirubin: 0.5 mg/dL (ref 0.3–1.2)
Total Protein: 6.8 g/dL (ref 6.5–8.1)

## 2017-07-02 LAB — CBC
HCT: 41.6 % (ref 36.0–46.0)
Hemoglobin: 13.8 g/dL (ref 12.0–15.0)
MCH: 30.6 pg (ref 26.0–34.0)
MCHC: 33.2 g/dL (ref 30.0–36.0)
MCV: 92.2 fL (ref 78.0–100.0)
PLATELETS: 284 10*3/uL (ref 150–400)
RBC: 4.51 MIL/uL (ref 3.87–5.11)
RDW: 13 % (ref 11.5–15.5)
WBC: 10 10*3/uL (ref 4.0–10.5)

## 2017-07-02 LAB — HCG, SERUM, QUALITATIVE: PREG SERUM: NEGATIVE

## 2017-07-02 SURGERY — MANIPULATION, KNEE, CLOSED
Anesthesia: General | Site: Knee | Laterality: Left

## 2017-07-02 MED ORDER — METHYLPREDNISOLONE ACETATE 40 MG/ML IJ SUSP
INTRAMUSCULAR | Status: AC
  Filled 2017-07-02: qty 1

## 2017-07-02 MED ORDER — LACTATED RINGERS IV SOLN
INTRAVENOUS | Status: DC
  Administered 2017-07-02: 10:00:00 via INTRAVENOUS

## 2017-07-02 MED ORDER — METHYLPREDNISOLONE ACETATE 40 MG/ML IJ SUSP
INTRAMUSCULAR | Status: DC | PRN
  Administered 2017-07-02: 40 mg via INTRA_ARTICULAR

## 2017-07-02 MED ORDER — PROPOFOL 10 MG/ML IV BOLUS
INTRAVENOUS | Status: DC | PRN
  Administered 2017-07-02 (×2): 100 mg via INTRAVENOUS

## 2017-07-02 MED ORDER — MIDAZOLAM HCL 2 MG/2ML IJ SOLN
INTRAMUSCULAR | Status: AC
Start: 2017-07-02 — End: 2017-07-02
  Administered 2017-07-02: 2 mg via INTRAVENOUS
  Filled 2017-07-02: qty 2

## 2017-07-02 MED ORDER — MIDAZOLAM HCL 2 MG/2ML IJ SOLN
INTRAMUSCULAR | Status: DC | PRN
  Administered 2017-07-02: 2 mg via INTRAVENOUS

## 2017-07-02 MED ORDER — FENTANYL CITRATE (PF) 250 MCG/5ML IJ SOLN
INTRAMUSCULAR | Status: DC | PRN
  Administered 2017-07-02: 50 ug via INTRAVENOUS

## 2017-07-02 MED ORDER — OXYCODONE-ACETAMINOPHEN 5-325 MG PO TABS: 1.0000 | ORAL_TABLET | Freq: Three times a day (TID) | ORAL | 0 refills | Status: AC | PRN

## 2017-07-02 MED ORDER — BUPIVACAINE-EPINEPHRINE (PF) 0.5% -1:200000 IJ SOLN
INTRAMUSCULAR | Status: AC
  Filled 2017-07-02: qty 30

## 2017-07-02 MED ORDER — MIDAZOLAM HCL 2 MG/2ML IJ SOLN
INTRAMUSCULAR | Status: AC
  Filled 2017-07-02: qty 2

## 2017-07-02 MED ORDER — MEPERIDINE HCL 25 MG/ML IJ SOLN: 6.2500 mg | INTRAMUSCULAR | Status: DC | PRN

## 2017-07-02 MED ORDER — LIDOCAINE 2% (20 MG/ML) 5 ML SYRINGE
INTRAMUSCULAR | Status: DC | PRN
  Administered 2017-07-02: 60 mg via INTRAVENOUS
  Administered 2017-07-02: 40 mg via INTRAVENOUS

## 2017-07-02 MED ORDER — ROPIVACAINE HCL 7.5 MG/ML IJ SOLN
INTRAMUSCULAR | Status: DC | PRN
  Administered 2017-07-02: 20 mL via PERINEURAL

## 2017-07-02 MED ORDER — MIDAZOLAM HCL 2 MG/2ML IJ SOLN
2.0000 mg | Freq: Once | INTRAMUSCULAR | Status: AC
  Administered 2017-07-02: 2 mg via INTRAVENOUS

## 2017-07-02 MED ORDER — FENTANYL CITRATE (PF) 100 MCG/2ML IJ SOLN
INTRAMUSCULAR | Status: AC
  Administered 2017-07-02: 100 ug via INTRAVENOUS
  Filled 2017-07-02: qty 2

## 2017-07-02 MED ORDER — PROPOFOL 10 MG/ML IV BOLUS
INTRAVENOUS | Status: AC
  Filled 2017-07-02: qty 20

## 2017-07-02 MED ORDER — FENTANYL CITRATE (PF) 100 MCG/2ML IJ SOLN: 25.0000 ug | INTRAMUSCULAR | Status: DC | PRN

## 2017-07-02 MED ORDER — BUPIVACAINE HCL (PF) 0.5 % IJ SOLN
INTRAMUSCULAR | Status: DC | PRN
  Administered 2017-07-02: 3 mL via INTRA_ARTICULAR

## 2017-07-02 MED ORDER — ONDANSETRON HCL 4 MG/2ML IJ SOLN: 4.0000 mg | Freq: Once | INTRAMUSCULAR | Status: DC | PRN

## 2017-07-02 MED ORDER — FENTANYL CITRATE (PF) 100 MCG/2ML IJ SOLN
100.0000 ug | Freq: Once | INTRAMUSCULAR | Status: AC
  Administered 2017-07-02: 100 ug via INTRAVENOUS

## 2017-07-02 MED ORDER — MORPHINE SULFATE (PF) 4 MG/ML IV SOLN
INTRAVENOUS | Status: AC
  Filled 2017-07-02: qty 1

## 2017-07-02 MED ORDER — ONDANSETRON HCL 4 MG/2ML IJ SOLN
INTRAMUSCULAR | Status: DC | PRN
  Administered 2017-07-02: 4 mg via INTRAVENOUS

## 2017-07-02 MED ORDER — MORPHINE SULFATE (PF) 4 MG/ML IV SOLN
INTRAVENOUS | Status: DC | PRN
  Administered 2017-07-02: 4 mg

## 2017-07-02 MED ORDER — FENTANYL CITRATE (PF) 250 MCG/5ML IJ SOLN
INTRAMUSCULAR | Status: AC
  Filled 2017-07-02: qty 5

## 2017-07-02 SURGICAL SUPPLY — 10 items
GLOVE BIO SURGEON STRL SZ8 (GLOVE) ×3 IMPLANT
GLOVE BIOGEL M 8.0 STRL (GLOVE) ×3 IMPLANT
GLOVE BIOGEL PI IND STRL 8 (GLOVE) ×1 IMPLANT
GLOVE BIOGEL PI INDICATOR 8 (GLOVE) ×2
GOWN STRL REUS W/ TWL LRG LVL3 (GOWN DISPOSABLE) IMPLANT
GOWN STRL REUS W/ TWL XL LVL3 (GOWN DISPOSABLE) ×2 IMPLANT
GOWN STRL REUS W/TWL LRG LVL3 (GOWN DISPOSABLE)
GOWN STRL REUS W/TWL XL LVL3 (GOWN DISPOSABLE) ×6
KIT ROOM TURNOVER OR (KITS) ×3 IMPLANT
PAD ARMBOARD 7.5X6 YLW CONV (MISCELLANEOUS) ×6 IMPLANT

## 2017-07-02 NOTE — H&P (Signed)
Sarah Hanson is an 46 y.o. female.   Chief Complaint:   Stiffness/decreased motion left knee HPI:   46 yo female with known post-operative left knee arthrofibrosis following a left total knee replacement.  A manipulation under anesthesia has been recommended at this point given the failure of physical therapy.  Past Medical History:  Diagnosis Date  . Anxiety    no med at this time--pt quit due to $  . Arthritis   . Bipolar disorder (HCC)    bipolar 1 - not on medication  . ETOH abuse   . History of kidney stones   . Marijuana abuse     Past Surgical History:  Procedure Laterality Date  . CESAREAN SECTION    . CESAREAN SECTION     x2  . TOTAL KNEE ARTHROPLASTY Left 04/14/2017   Procedure: LEFT TOTAL KNEE ARTHROPLASTY;  Surgeon: Kathryne HitchBlackman, Tarrah Furuta Y, MD;  Location: [facility];  Service: Orthopedics;  Laterality: Left;  . TUBAL LIGATION      Family History  Problem Relation Age of Onset  . Bipolar disorder Mother   . Kidney failure Mother   . COPD Mother    Social History:  reports that she has been smoking Cigarettes.  She has a 30.00 pack-year smoking history. She has never used smokeless tobacco. She reports that she drinks alcohol. She reports that she uses drugs, including Marijuana.  Allergies:  Allergies  Allergen Reactions  . No Known Allergies     Medications Prior to Admission  Medication Sig Dispense Refill  . acetaminophen (TYLENOL) 650 MG CR tablet Take 650 mg by mouth 3 (three) times daily as needed for pain.    Marland Kitchen. gabapentin (NEURONTIN) 300 MG capsule Take 300 mg by mouth daily.    Marland Kitchen. HYDROcodone-acetaminophen (NORCO/VICODIN) 5-325 MG tablet Take 1-2 tablets by mouth 2 (two) times daily as needed for moderate pain. 60 tablet 0  . ibuprofen (ADVIL,MOTRIN) 200 MG tablet Take 400 mg by mouth 3 (three) times daily as needed for mild pain.    Marland Kitchen. aspirin EC 325 MG EC tablet Take 1 tablet (325 mg total) by mouth 2 (two) times daily after a meal. (Patient not taking:  Reported on 06/29/2017) 30 tablet 0  . ibuprofen (ADVIL,MOTRIN) 800 MG tablet Take 1 tablet (800 mg total) by mouth every 8 (eight) hours as needed. (Patient not taking: Reported on 06/29/2017) 90 tablet 0  . naproxen (NAPROSYN) 375 MG tablet Take 1 tablet (375 mg total) by mouth 2 (two) times daily. (Patient not taking: Reported on 06/29/2017) 20 tablet 0  . oxyCODONE-acetaminophen (ROXICET) 5-325 MG tablet Take 1-2 tablets by mouth every 8 (eight) hours as needed. (Patient not taking: Reported on 06/29/2017) 60 tablet 0  . tiZANidine (ZANAFLEX) 4 MG tablet Take 1 tablet (4 mg total) by mouth every 8 (eight) hours as needed for muscle spasms. (Patient not taking: Reported on 06/29/2017) 60 tablet 0    No results found for this or any previous visit (from the past 48 hour(s)). No results found.  Review of Systems  All other systems reviewed and are negative.   Blood pressure (!) 169/82, pulse 79, temperature 98.3 F (36.8 C), temperature source Oral, resp. rate 19, height 5\' 4"  (1.626 m), weight 200 lb (90.7 kg), last menstrual period 06/22/2017, SpO2 99 %. Physical Exam  Constitutional: She is oriented to person, place, and time. She appears well-developed and well-nourished.  HENT:  Head: Normocephalic and atraumatic.  Eyes: Pupils are equal, round, and reactive  to light. EOM are normal.  Neck: Normal range of motion. Neck supple.  Cardiovascular: Normal rate and regular rhythm.   Respiratory: Effort normal and breath sounds normal.  GI: Soft. Bowel sounds are normal.  Musculoskeletal:       Left knee: She exhibits decreased range of motion.  Neurological: She is alert and oriented to person, place, and time.  Skin: Skin is warm and dry.  Psychiatric: She has a normal mood and affect.     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  Risks and benefits  have been discussed in detail and informed consent obtained.  Kathryne Hitch, MD 07/02/2017, 9:10 AM

## 2017-07-02 NOTE — Anesthesia Procedure Notes (Signed)
Procedure Name: MAC Date/Time: 07/02/2017 10:21 AM Performed by: Mervyn Gay Pre-anesthesia Checklist: Patient identified, Patient being monitored, Timeout performed, Emergency Drugs available and Suction available Patient Re-evaluated:Patient Re-evaluated prior to induction Oxygen Delivery Method: Simple face mask Preoxygenation: Pre-oxygenation with 100% oxygen Number of attempts: 1 Placement Confirmation: positive ETCO2 Dental Injury: Teeth and Oropharynx as per pre-operative assessment

## 2017-07-02 NOTE — Transfer of Care (Signed)
Immediate Anesthesia Transfer of Care Note  Patient: Sarah Hanson  Procedure(s) Performed: Procedure(s): CLOSED MANIPULATION LEFT KNEE (Left)  Patient Location: PACU  Anesthesia Type:MAC  Level of Consciousness: awake, alert , oriented and patient cooperative  Airway & Oxygen Therapy: Patient Spontanous Breathing and Patient connected to nasal cannula oxygen  Post-op Assessment: Report given to RN, Post -op Vital signs reviewed and stable and Patient moving all extremities X 4  Post vital signs: Reviewed and stable  Last Vitals:  Vitals:   07/02/17 1010 07/02/17 1015  BP:  (!) 178/91  Pulse: 95 90  Resp: 18 19  Temp:    SpO2: 100% 99%    Last Pain:  Vitals:   07/02/17 0930  TempSrc:   PainSc: 3       Patients Stated Pain Goal: 5 (15/83/09 4076)  Complications: No apparent anesthesia complications

## 2017-07-02 NOTE — Anesthesia Postprocedure Evaluation (Signed)
Anesthesia Post Note  Patient: Sarah Hanson  Procedure(s) Performed: Procedure(s) (LRB): CLOSED MANIPULATION LEFT KNEE (Left)     Patient location during evaluation: PACU Anesthesia Type: General Level of consciousness: awake and alert Pain management: pain level controlled Vital Signs Assessment: post-procedure vital signs reviewed and stable Respiratory status: spontaneous breathing, nonlabored ventilation, respiratory function stable and patient connected to nasal cannula oxygen Cardiovascular status: blood pressure returned to baseline and stable Postop Assessment: no apparent nausea or vomiting Anesthetic complications: no    Last Vitals:  Vitals:   07/02/17 1115 07/02/17 1120  BP: (!) 142/92 (!) 142/82  Pulse: 65 66  Resp: 19 (!) 21  Temp:  36.7 C  SpO2: 97% 98%    Last Pain:  Vitals:   07/02/17 1100  TempSrc:   PainSc: 0-No pain                 Murat Rideout

## 2017-07-02 NOTE — Anesthesia Preprocedure Evaluation (Signed)
Anesthesia Evaluation  Patient identified by MRN, date of birth, ID band Patient awake    Reviewed: Allergy & Precautions, NPO status , Patient's Chart, lab work & pertinent test results  Airway Mallampati: II  TM Distance: >3 FB Neck ROM: Full    Dental no notable dental hx.    Pulmonary Current Smoker,    Pulmonary exam normal breath sounds clear to auscultation       Cardiovascular negative cardio ROS Normal cardiovascular exam Rhythm:Regular Rate:Normal     Neuro/Psych Anxiety negative neurological ROS     GI/Hepatic negative GI ROS, (+)     substance abuse  ,   Endo/Other  negative endocrine ROS  Renal/GU negative Renal ROS  negative genitourinary   Musculoskeletal  (+) Arthritis , Osteoarthritis,    Abdominal   Peds negative pediatric ROS (+)  Hematology negative hematology ROS (+)   Anesthesia Other Findings Obese   Reproductive/Obstetrics negative OB ROS                             Anesthesia Physical  Anesthesia Plan  ASA: II  Anesthesia Plan: General   Post-op Pain Management:  Regional for Post-op pain   Induction: Intravenous  PONV Risk Score and Plan: 1 and Ondansetron, Dexamethasone, Propofol and Midazolam  Airway Management Planned: LMA, Nasal Cannula, Natural Airway and Mask  Additional Equipment:   Intra-op Plan:   Post-operative Plan:   Informed Consent: I have reviewed the patients History and Physical, chart, labs and discussed the procedure including the risks, benefits and alternatives for the proposed anesthesia with the patient or authorized representative who has indicated his/her understanding and acceptance.   Dental advisory given  Plan Discussed with: CRNA  Anesthesia Plan Comments: (Recent lab results reviewed)        Anesthesia Quick Evaluation

## 2017-07-02 NOTE — Anesthesia Procedure Notes (Signed)
Anesthesia Regional Block: Adductor canal block   Pre-Anesthetic Checklist: ,, timeout performed, Correct Patient, Correct Site, Correct Laterality, Correct Procedure, Correct Position, site marked, Risks and benefits discussed,  Surgical consent,  Pre-op evaluation,  At surgeon's request and post-op pain management  Laterality: Left  Prep: chloraprep       Needles:  Injection technique: Single-shot  Needle Type: Echogenic Stimulator Needle     Needle Length: 5cm  Needle Gauge: 22     Additional Needles:   Procedures: ultrasound guided, nerve stimulator,,,,,,   Nerve Stimulator or Paresthesia:  Response: 0.45 mA,   Additional Responses:   Narrative:  Start time: 07/02/2017 10:00 AM End time: 07/02/2017 10:10 AM Injection made incrementally with aspirations every 5 mL.  Performed by: Personally  Anesthesiologist: Burnette Sautter  Additional Notes: Functioning IV was confirmed and monitors were applied.  A 50mm 22ga Arrow echogenic stimulator needle was used. Sterile prep and drape,hand hygiene and sterile gloves were used. Ultrasound guidance: relevant anatomy identified, needle position confirmed, local anesthetic spread visualized around nerve(s)., vascular puncture avoided.  Image printed for medical record. Negative aspiration and negative test dose prior to incremental administration of local anesthetic. The patient tolerated the procedure well.

## 2017-07-02 NOTE — Discharge Instructions (Signed)
Get your knee bending as much as possible.

## 2017-07-02 NOTE — Brief Op Note (Signed)
07/02/2017  10:45 AM  PATIENT:  Sarah Hanson  46 y.o. female  PRE-OPERATIVE DIAGNOSIS:  arthrofibrosis left knee  POST-OPERATIVE DIAGNOSIS:  arthrofibrosis left knee  PROCEDURE:  Procedure(s): CLOSED MANIPULATION LEFT KNEE (Left)  SURGEON:  Surgeon(s) and Role:    Kathryne Hitch* Blackman, Christopher Y, MD - Primary  ANESTHESIA:   local and IV sedation  EBL:  Total I/O In: 500 [I.V.:500] Out: 5 [Blood:5]  DICTATION: .Other Dictation: Dictation Number [id number]  PLAN OF CARE: Discharge to home after PACU  PATIENT DISPOSITION:  PACU - hemodynamically stable.   Delay start of Pharmacological VTE agent (>24hrs) due to surgical blood loss or risk of bleeding: not applicable

## 2017-07-03 ENCOUNTER — Encounter (HOSPITAL_COMMUNITY): Payer: Self-pay | Admitting: Orthopaedic Surgery

## 2017-07-03 NOTE — Op Note (Signed)
NAME:  Sarah Hanson, Sarah Hanson                     ACCOUNT NO.:  MEDICAL RECORD NO.:  [id number]  LOCATION:                                 FACILITY:  PHYSICIAN:  Vanita Panda. Magnus Ivan, M.D.DATE OF BIRTH:  1971/05/04  DATE OF PROCEDURE:  07/02/2017 DATE OF DISCHARGE:                              OPERATIVE REPORT   PREOPERATIVE DIAGNOSIS:  Left knee postoperative arthrofibrosis, status post total knee arthroplasty.  POSTOPERATIVE DIAGNOSIS:  Left knee postoperative arthrofibrosis, status post total knee arthroplasty.  PROCEDURE:  Left knee manipulation under anesthesia.  SURGEON:  Vanita Panda. Magnus Ivan, M.D.  ANESTHESIA: 1. Mask ventilation and IV sedation, propofol. 2. Local with mixture of 1 mL of morphine with 1 mL of 40 of Depo-     Medrol and 3 mL of Marcaine.  BLOOD LOSS:  Not applicable.  COMPLICATIONS:  None.  INDICATIONS:  [name] is a 46 year old female, status post left total knee arthroplasty, has developed postoperative arthrofibrosis. She can only flex to about 90 degrees and her extensions lacking, full extension by about 30 degrees.  At this point, with failure of conservative treatment and therapy, we have recommended a manipulation under anesthesia.  She is fully aware of the risks and benefits of this and why we are recommending it.  PROCEDURE DESCRIPTION:  After informed consent was obtained, appropriate left knee was marked.  She was brought to the operating room and placed supine on the operating table.  Mask ventilation, IV sedation was obtained.  A time-out was called and she was identified as correct patient and correct left knee.  I then gently manipulated the knee and was able to flex her all the way back to about 115-120 degrees.  I got her to full extension as well.  I took a photograph of her knee with that all the way flexed back and then put her through several cycles of flexion and extension.  I then cleaned the knee with Betadine  and alcohol and inserted the mixture of morphine, Marcaine and Depo-Medrol onto the knee.  A Band-Aid was placed.  She was taken to the recovery room in stable condition, awake and alert, and doing well. Postoperatively, we will have her aggressively get her knee moving as well.    Vanita Panda. Magnus Ivan, M.D.    CYB/MEDQ  D:  07/02/2017  T:  07/03/2017  Job:  [id number]

## 2017-07-07 ENCOUNTER — Telehealth (INDEPENDENT_AMBULATORY_CARE_PROVIDER_SITE_OTHER): Payer: Self-pay | Admitting: Radiology

## 2017-07-07 NOTE — Telephone Encounter (Signed)
Error

## 2017-07-09 ENCOUNTER — Ambulatory Visit (INDEPENDENT_AMBULATORY_CARE_PROVIDER_SITE_OTHER): Payer: Self-pay | Admitting: Orthopaedic Surgery

## 2017-07-09 ENCOUNTER — Ambulatory Visit: Payer: Self-pay

## 2017-07-09 DIAGNOSIS — Z96652 Presence of left artificial knee joint: Secondary | ICD-10-CM

## 2017-07-09 DIAGNOSIS — T8482XA Fibrosis due to internal orthopedic prosthetic devices, implants and grafts, initial encounter: Secondary | ICD-10-CM

## 2017-07-09 MED ORDER — HYDROCODONE-ACETAMINOPHEN 5-325 MG PO TABS: 1.0000 | ORAL_TABLET | Freq: Two times a day (BID) | ORAL | 0 refills | Status: DC | PRN

## 2017-07-09 NOTE — Progress Notes (Signed)
The patient is one-week status post a manipulation under anesthesia of a right total knee arthroplasty. This was due to postoperative arthrofibrosis. She's only 46 years old and has not been able to push her self through the pain. Her therapy is been limited as well.  On exam the knee is swollen to be expected but no evidence infection. The knee feels ligaments stable. I cannot flex her to about 90. I was able to show her actual photographs of her knee and showed her where we were able to bend her knee to 120 of flexion.  I encouraged her to get her knee bending and moving as much as possible. I gave her one time one more prescription of hydrocodone. We'll see her back in 4 weeks to see how she doing overall. No x-rays are needed at that visit.

## 2017-07-16 ENCOUNTER — Ambulatory Visit: Payer: Self-pay

## 2017-07-23 ENCOUNTER — Ambulatory Visit: Payer: Self-pay

## 2017-08-06 ENCOUNTER — Ambulatory Visit (INDEPENDENT_AMBULATORY_CARE_PROVIDER_SITE_OTHER): Payer: Self-pay | Admitting: Orthopaedic Surgery

## 2017-08-24 ENCOUNTER — Telehealth (INDEPENDENT_AMBULATORY_CARE_PROVIDER_SITE_OTHER): Payer: Self-pay | Admitting: Orthopaedic Surgery

## 2017-08-24 NOTE — Telephone Encounter (Signed)
That will be fine for her to donate plasma

## 2017-08-24 NOTE — Telephone Encounter (Signed)
Patient's request a call back to states that she's ok to give plasma.

## 2017-08-24 NOTE — Telephone Encounter (Signed)
Ok for her to give plasma?

## 2017-08-25 ENCOUNTER — Encounter (INDEPENDENT_AMBULATORY_CARE_PROVIDER_SITE_OTHER): Payer: Self-pay

## 2017-08-25 NOTE — Telephone Encounter (Signed)
Patient aware she may donate

## 2017-08-26 ENCOUNTER — Telehealth (INDEPENDENT_AMBULATORY_CARE_PROVIDER_SITE_OTHER): Payer: Self-pay | Admitting: Orthopaedic Surgery

## 2017-08-26 NOTE — Telephone Encounter (Signed)
Re-faxed and let patient know

## 2017-08-26 NOTE — Telephone Encounter (Signed)
Patient called asking if the okay for her to donate plasma could be faxed to [facility] [phone].  If you could give her a call whenever its been sent. Thank you. CB # [phone]

## 2017-08-28 ENCOUNTER — Telehealth (INDEPENDENT_AMBULATORY_CARE_PROVIDER_SITE_OTHER): Payer: Self-pay | Admitting: Orthopaedic Surgery

## 2017-08-28 ENCOUNTER — Encounter (INDEPENDENT_AMBULATORY_CARE_PROVIDER_SITE_OTHER): Payer: Self-pay

## 2017-08-28 NOTE — Telephone Encounter (Signed)
Patient called this morning stating that [facility] told her that they needed more information in order for her to donate plasma.  They are needing the date of the surgery, reason, what it consisted of and that it is okay to donate plasma.  Their fax number is: [phone].  CB#[phone].  Thank you.

## 2017-08-28 NOTE — Telephone Encounter (Signed)
Faxed to provided number  

## 2017-10-06 ENCOUNTER — Other Ambulatory Visit (INDEPENDENT_AMBULATORY_CARE_PROVIDER_SITE_OTHER): Payer: Self-pay

## 2017-10-06 ENCOUNTER — Telehealth (INDEPENDENT_AMBULATORY_CARE_PROVIDER_SITE_OTHER): Payer: Self-pay | Admitting: Orthopaedic Surgery

## 2017-10-06 MED ORDER — NAPROXEN 500 MG PO TABS: 500.0000 mg | ORAL_TABLET | Freq: Two times a day (BID) | ORAL | 1 refills | Status: DC

## 2017-10-06 MED ORDER — GABAPENTIN 300 MG PO CAPS: 300.0000 mg | ORAL_CAPSULE | Freq: Every day | ORAL | 1 refills | Status: DC

## 2017-10-06 NOTE — Telephone Encounter (Signed)
That will be fine to call in both of these for her

## 2017-10-06 NOTE — Telephone Encounter (Signed)
Please advise 

## 2017-10-06 NOTE — Telephone Encounter (Signed)
Patient requests RX of Neurontin and Naproxen. She would like it called in to [facility]. Her CB # [phone]

## 2017-10-06 NOTE — Telephone Encounter (Signed)
Called into pharmacy

## 2017-10-08 ENCOUNTER — Telehealth (INDEPENDENT_AMBULATORY_CARE_PROVIDER_SITE_OTHER): Payer: Self-pay | Admitting: Orthopaedic Surgery

## 2018-08-27 IMAGING — MR MR KNEE*L* W/O CM
7 series · 40 of 40 positions shown · non-contrast
Comparison: None.

CLINICAL DATA: Left knee pain.  Swelling grinding for 3 months.

EXAM:
MRI OF THE LEFT KNEE WITHOUT CONTRAST
TECHNIQUE: Multiplanar, multisequence MR imaging of the knee was performed. No
intravenous contrast was administered.

[Series 3: PD fat-sat · axial · 4.0mm · 0.50mm/px · z∈[-82,+22]mm · 6 of 22 slices shown (1 of 3)]
[im 1/22]
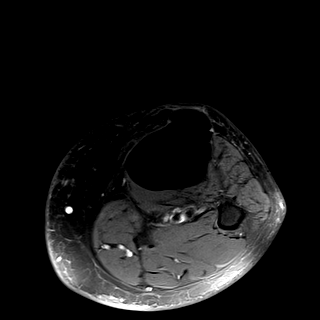
[im 5/22]
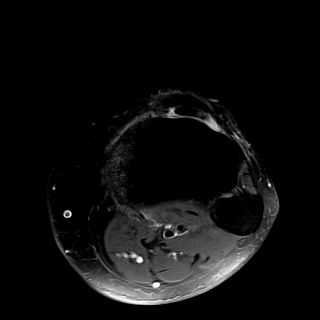
[im 9/22]
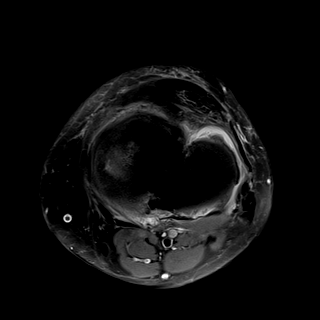
[im 13/22]
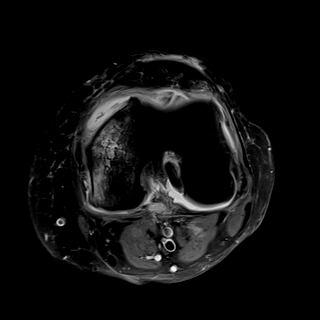
[im 17/22]
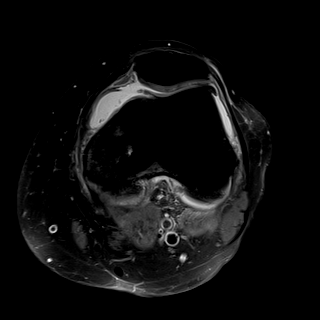
[im 22/22]
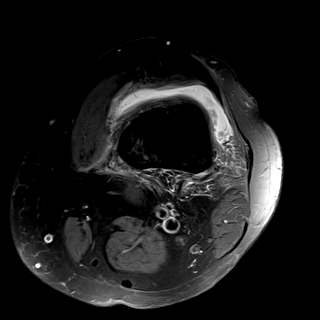

[Series 4: PD fat-sat · coronal · 4.0mm · 0.50mm/px · 7 of 23 slices shown (2 of 3)]
[im 1/23]
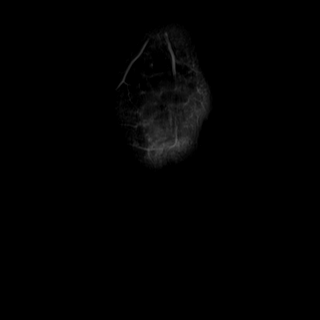
[im 4/23]
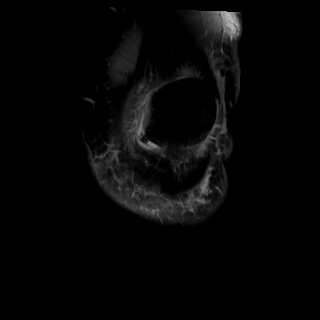
[im 8/23]
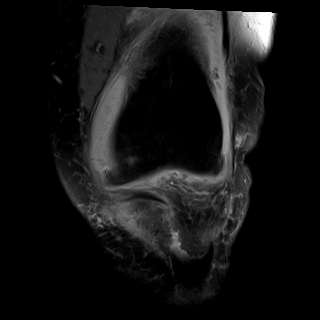
[im 12/23]
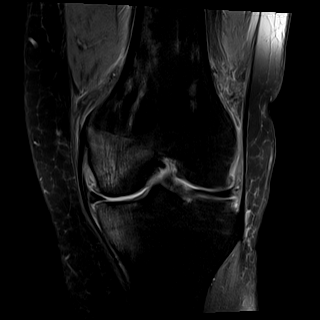
[im 15/23]
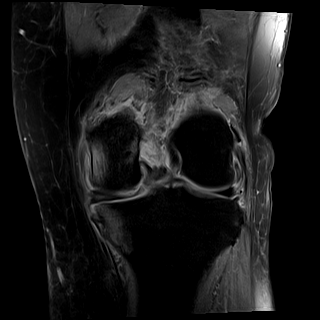
[im 19/23]
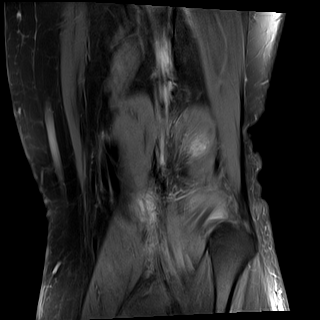
[im 23/23]
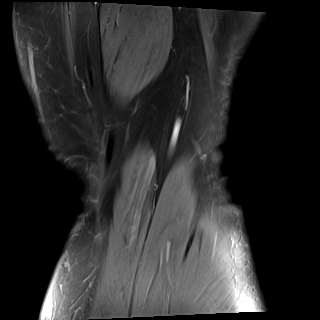

[Series 5: PD fat-sat · sagittal · 4.0mm · 0.50mm/px · 7 of 22 slices shown (3 of 3)]
[im 1/22]
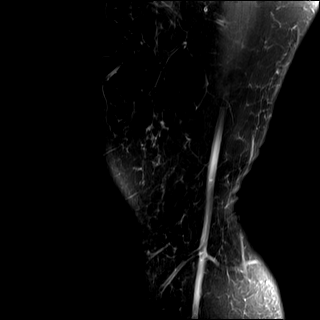
[im 4/22]
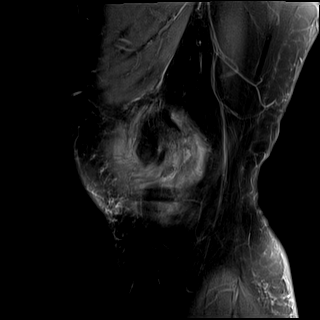
[im 8/22]
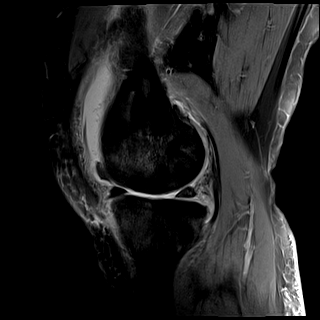
[im 11/22]
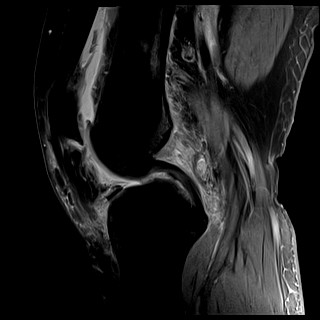
[im 15/22]
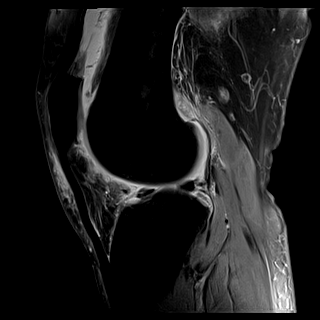
[im 18/22]
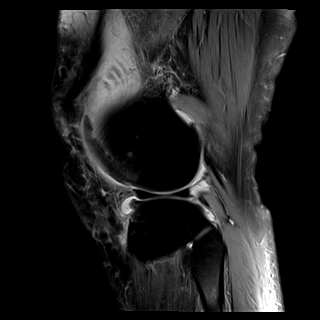
[im 22/22]
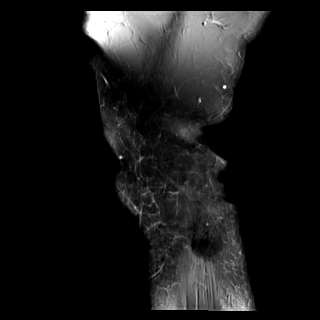

[Series 6: T2 fat-sat · coronal · 4.0mm · 0.50mm/px · 7 of 23 slices shown]
[im 1/23]
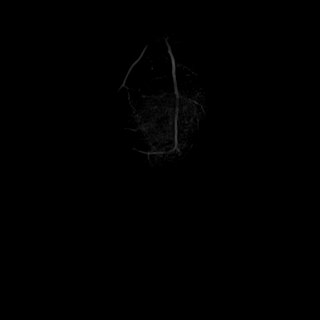
[im 4/23]
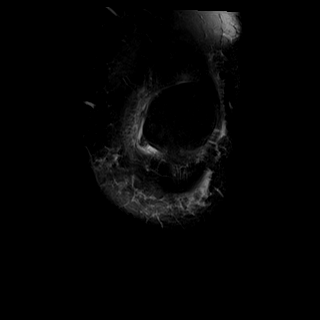
[im 8/23]
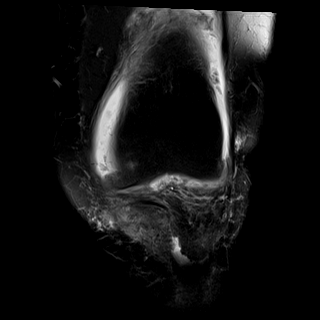
[im 12/23]
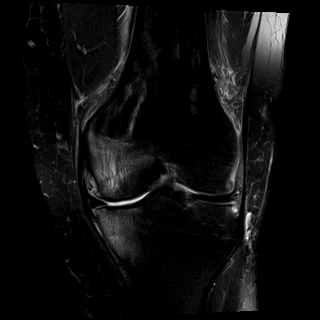
[im 15/23]
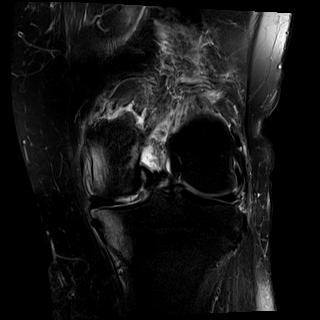
[im 19/23]
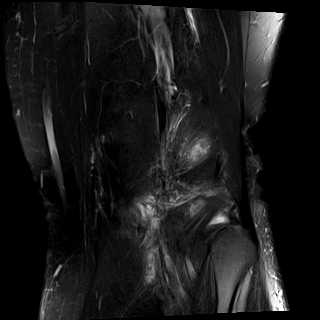
[im 23/23]
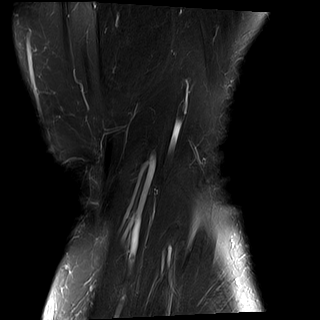

[Series 7: T1 · coronal · 4.0mm · 0.62mm/px · 7 of 23 slices shown]
[im 1/23]
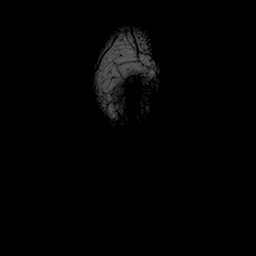
[im 4/23]
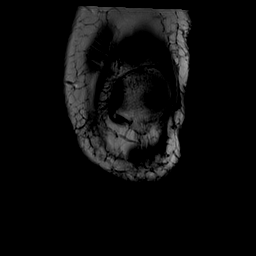
[im 8/23]
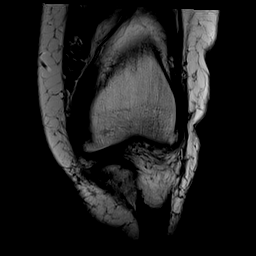
[im 12/23]
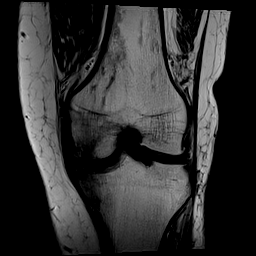
[im 15/23]
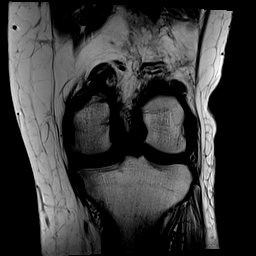
[im 19/23]
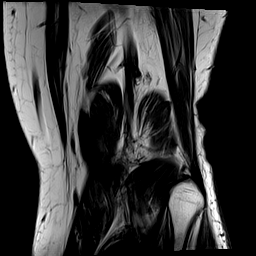
[im 23/23]
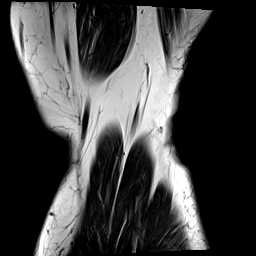

[Series 8: PD · coronal · 2.0mm · 0.50mm/px · 5 of 15 slices shown]
[im 1/15]
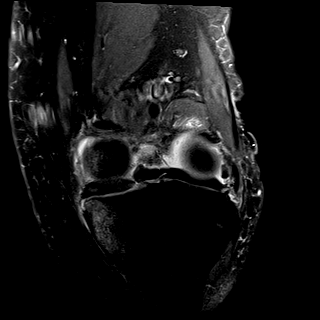
[im 4/15]
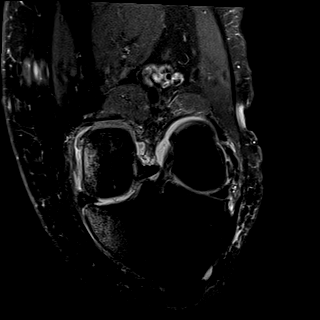
[im 8/15]
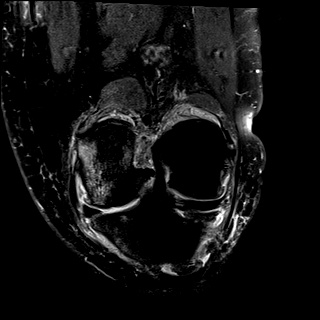
[im 11/15]
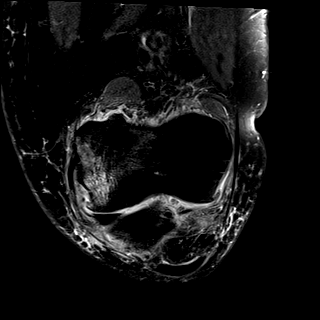
[im 15/15]
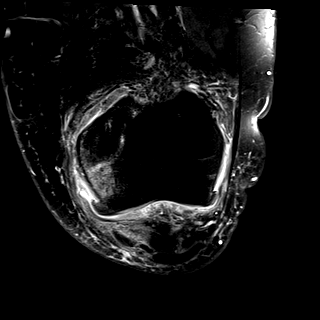

[Series 100: hx · axial · 8.0mm · 0.68mm/px · 1 of 1 slices shown]
[im 1/1]
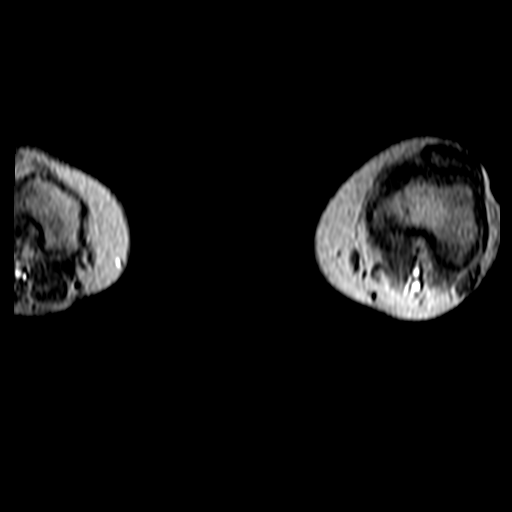

[40 of 40 positions shown; findings below may reference images not displayed]

FINDINGS: MENISCI

Medial meniscus: Radial tear of the posterior horn of the medial
meniscus with peripheral meniscal extrusion.

Lateral meniscus:  Intact.

LIGAMENTS

Cruciates:  Intact ACL and PCL.

Collaterals: Medial collateral ligament is intact. Lateral
collateral ligament complex is intact.

CARTILAGE

Patellofemoral: Mild partial-thickness cartilage loss of the
patellofemoral compartment.

Medial: Extensive full-thickness cartilage loss of the medial
femoral condyle and medial tibial plateau with severe subchondral
reactive marrow edema in the periphery of the medial femoral condyle
and medial tibial plateau. Small marginal osteophytes.

Lateral:  No chondral defect.  Small marginal osteophytes.

Joint: Large joint effusion. Edema in Hoffa's fat. No plical
thickening.

Popliteal Fossa:  No Baker cyst. Intact popliteus tendon.

Extensor Mechanism: Intact quadriceps tendon and patellar tendon.
Intact medial and lateral patellar retinaculum. Intact MPFL.

Bones: No other marrow signal abnormality. No fracture or
dislocation.

Other: No fluid collection or hematoma.
IMPRESSION: 1. Radial tear of the posterior horn of the medial meniscus with
peripheral meniscal extrusion.
2. Extensive full-thickness cartilage loss of the medial femoral
condyle and medial tibial plateau with severe subchondral reactive
marrow edema in the periphery of the medial femoral condyle and
medial tibial plateau.
3. Large joint effusion.

## 2019-10-10 ENCOUNTER — Emergency Department (HOSPITAL_BASED_OUTPATIENT_CLINIC_OR_DEPARTMENT_OTHER): Payer: Self-pay

## 2019-10-10 ENCOUNTER — Emergency Department (HOSPITAL_BASED_OUTPATIENT_CLINIC_OR_DEPARTMENT_OTHER)
Admission: EM | Admit: 2019-10-10 | Discharge: 2019-10-10 | Disposition: A | Discharge status: Home / Self Care | Payer: Self-pay | Attending: Emergency Medicine | Admitting: Emergency Medicine

## 2019-10-10 ENCOUNTER — Other Ambulatory Visit: Payer: Self-pay

## 2019-10-10 ENCOUNTER — Encounter (HOSPITAL_BASED_OUTPATIENT_CLINIC_OR_DEPARTMENT_OTHER): Payer: Self-pay | Admitting: *Deleted

## 2019-10-10 DIAGNOSIS — Z96652 Presence of left artificial knee joint: Secondary | ICD-10-CM | POA: Insufficient documentation

## 2019-10-10 DIAGNOSIS — M1711 Unilateral primary osteoarthritis, right knee: Secondary | ICD-10-CM | POA: Insufficient documentation

## 2019-10-10 DIAGNOSIS — F17210 Nicotine dependence, cigarettes, uncomplicated: Secondary | ICD-10-CM | POA: Insufficient documentation

## 2019-10-10 DIAGNOSIS — M25561 Pain in right knee: Secondary | ICD-10-CM

## 2019-10-10 DIAGNOSIS — Z79899 Other long term (current) drug therapy: Secondary | ICD-10-CM | POA: Insufficient documentation

## 2019-10-10 MED ORDER — NAPROXEN 500 MG PO TABS: 500.0000 mg | ORAL_TABLET | Freq: Two times a day (BID) | ORAL | 0 refills | Status: AC

## 2019-10-10 MED ORDER — KETOROLAC TROMETHAMINE 15 MG/ML IJ SOLN
15.0000 mg | Freq: Once | INTRAMUSCULAR | Status: AC
  Administered 2019-10-10: 16:00:00 15 mg via INTRAMUSCULAR
  Filled 2019-10-10: qty 1

## 2019-10-10 NOTE — ED Notes (Signed)
Rt knee pain x 4 days  Hx of same

## 2019-10-10 NOTE — Discharge Instructions (Addendum)
Take naproxen 2 times a day with meals.  Do not take other anti-inflammatories at the same time (Advil, Motrin, ibuprofen, Aleve). You may supplement with Tylenol if you need further pain control. Use ice packs for pain and swelling. Follow-up with either Dr. Ninfa Linden or Dr. Ricki Rodriguez for further management of your knee. Follow-up with  and wellness as needed for primary care and financial assistance. Return to the emergency room with any new, worsening, or concerning symptoms.

## 2019-10-10 NOTE — ED Provider Notes (Signed)
[facility] EMERGENCY DEPARTMENT Provider Note   CSN: [id number] Arrival date & time: 10/10/19  1330     History Chief Complaint  Patient presents with  . Knee Pain    Sarah Hanson is a 48 y.o. female presenting for evaluation of R knee pain.   Patient states for the past 4 days, she has had persistent right knee pain.  This began after she walked home from the store.  Pain is mostly on the medial aspect.  Pain is constant, worse with weightbearing and movement.  Nothing makes it better.  She took tylenol yesterday, has not taken anything today.  Patient states she had knee replacement done with Dr. Ninfa Linden 2.5 years ago, was told that she would need a knee replacement on her right knee as well.  She denies fall, trauma, or injury.  She states she takes no medications daily.  Pain does not radiate.  She denies numbness or tingling.  HPI     Past Medical History:  Diagnosis Date  . Anxiety    no med at this time--pt quit due to $  . Arthritis   . Bipolar disorder ([place])    bipolar 1 - not on medication  . ETOH abuse   . History of kidney stones   . Marijuana abuse     Patient Active Problem List   Diagnosis Date Noted  . Arthrofibrosis of left total knee arthroplasty, initial encounter ([place]) 07/02/2017  . Status post total knee replacement, left 04/14/2017  . Unilateral primary osteoarthritis, left knee 02/23/2017  . Left knee pain 12/15/2016    Past Surgical History:  Procedure Laterality Date  . CESAREAN SECTION    . CESAREAN SECTION     x2  . KNEE CLOSED REDUCTION Left 07/02/2017   Procedure: CLOSED MANIPULATION LEFT KNEE;  Surgeon: Mcarthur Rossetti, MD;  Location: [place];  Service: Orthopedics;  Laterality: Left;  . TOTAL KNEE ARTHROPLASTY Left 04/14/2017   Procedure: LEFT TOTAL KNEE ARTHROPLASTY;  Surgeon: Mcarthur Rossetti, MD;  Location: [place];  Service: Orthopedics;  Laterality: Left;  . TUBAL LIGATION       OB History   No  obstetric history on file.     Family History  Problem Relation Age of Onset  . Bipolar disorder Mother   . Kidney failure Mother   . COPD Mother     Social History   Tobacco Use  . Smoking status: Current Every Day Smoker    Packs/day: 1.00    Years: 30.00    Pack years: 30.00    Types: Cigarettes  . Smokeless tobacco: Never Used  Substance Use Topics  . Alcohol use: Yes    Comment: 2 weeks- beer heavy drinker in the past  . Drug use: Yes    Types: Marijuana    Comment: 2 weeks ago    Home Medications Prior to Admission medications   Medication Sig Start Date End Date Taking? Authorizing Provider  acetaminophen (TYLENOL) 650 MG CR tablet Take 650 mg by mouth 3 (three) times daily as needed for pain.    [provider]  gabapentin (NEURONTIN) 300 MG capsule Take 1 capsule (300 mg total) by mouth daily. 10/06/17   Mcarthur Rossetti, MD  HYDROcodone-acetaminophen (NORCO/VICODIN) 5-325 MG tablet Take 1-2 tablets by mouth 2 (two) times daily as needed for moderate pain. 07/09/17   Mcarthur Rossetti, MD  ibuprofen (ADVIL,MOTRIN) 200 MG tablet Take 400 mg by mouth 3 (three) times daily as  needed for mild pain.    [provider]  naproxen (NAPROSYN) 500 MG tablet Take 1 tablet (500 mg total) by mouth 2 (two) times daily with a meal. 10/10/19   Gevin Perea, PA-C  oxyCODONE-acetaminophen (ROXICET) 5-325 MG tablet Take 1-2 tablets by mouth every 8 (eight) hours as needed. 07/02/17   Kathryne Hitch, MD    Allergies    No known allergies  Review of Systems   Review of Systems  Musculoskeletal: Positive for arthralgias.  Neurological: Negative for numbness.    Physical Exam Updated Vital Signs BP (!) 162/98   Pulse 88   Temp 98.6 F (37 C)   Resp 18   Ht 5\' 4"  (1.626 m)   Wt 86.2 kg   SpO2 99%   BMI 32.61 kg/m   Physical Exam Vitals and nursing note reviewed.  Constitutional:      General: She is not in acute distress.     Appearance: She is well-developed.     Comments: Sitting comfortably in the bed in no acute distress  HENT:     Head: Normocephalic and atraumatic.  Pulmonary:     Effort: Pulmonary effort is normal.  Abdominal:     General: There is no distension.  Musculoskeletal:        General: Swelling and tenderness present. Normal range of motion.     Cervical back: Normal range of motion.     Comments: Mild swelling of the right knee.  No erythema or warmth.  No difficulty ranging.  Tenderness palpation along the medial joint line.  No tenderness palpation elsewhere.  Pedal pulse 2+ bilaterally.  Patient is ambulatory with a limp.  Skin:    General: Skin is warm.     Capillary Refill: Capillary refill takes less than 2 seconds.     Findings: No rash.  Neurological:     Mental Status: She is alert and oriented to person, place, and time.     ED Results / Procedures / Treatments   Labs (all labs ordered are listed, but only abnormal results are displayed) Labs Reviewed - No data to display  EKG None  Radiology DG Knee Complete 4 Views Right  Result Date: 10/10/2019 CLINICAL DATA:  Right knee pain without injury. Swelling. Difficulty bearing weight. EXAM: RIGHT KNEE - COMPLETE 4+ VIEW COMPARISON:  No recent prior. FINDINGS: No prominent effusion. Corticated bony density noted along the superior surface of the patella, possibly related to old fracture or bipartite patella. Mild tricompartment degenerative change. Degenerative changes most prominent about patellofemoral joint. No acute abnormality identified. IMPRESSION: Mild tricompartment degenerative change, most prominent about patellofemoral joint. No acute abnormality identified. Electronically Signed   By: 10/12/2019  Register   On: 10/10/2019 14:12    Procedures Procedures (including critical care time)  Medications Ordered in ED Medications  ketorolac (TORADOL) 15 MG/ML injection 15 mg (15 mg Intramuscular Given 10/10/19 1534)     ED Course  I have reviewed the triage vital signs and the nursing notes.  Pertinent labs & imaging results that were available during my care of the patient were reviewed by me and considered in my medical decision making (see chart for details).    MDM Rules/Calculators/A&P                      Patient presenting for evaluation of right knee pain.  Physical exam reassuring, she is neurovascularly intact.  X-rays obtained from triage reviewed interpreted by me.  Shows arthritis,  no fracture dislocation.  Arthritis is likely flared due to recent trip to the store.  Discussed findings with patient.  Discussed treatment for inflammation, however ultimately she will likely need follow-up with orthopedics.  Patient would like to follow-up with Dr. Magnus IvanBlackman, however I also gave information for the on-call orthopedic doctor.  At this time, patient appears safe for discharge.  Return precautions given.  Patient states she understands and agrees to plan.  Final Clinical Impression(s) / ED Diagnoses Final diagnoses:  Acute pain of right knee  Arthritis of right knee    Rx / DC Orders ED Discharge Orders         Ordered    naproxen (NAPROSYN) 500 MG tablet  2 times daily with meals     10/10/19 1523           Caitlain Tweed, PA-C 10/10/19 1627    Vanetta MuldersZackowski, Scott, MD 10/13/19 [phone]

## 2019-10-10 NOTE — ED Triage Notes (Signed)
Pt c/o right knee pain w/o injury x 4 days

## 2019-10-20 ENCOUNTER — Other Ambulatory Visit: Payer: Self-pay

## 2019-10-20 ENCOUNTER — Emergency Department (HOSPITAL_BASED_OUTPATIENT_CLINIC_OR_DEPARTMENT_OTHER): Payer: Self-pay

## 2019-10-20 ENCOUNTER — Emergency Department (HOSPITAL_BASED_OUTPATIENT_CLINIC_OR_DEPARTMENT_OTHER)
Admission: EM | Admit: 2019-10-20 | Discharge: 2019-10-20 | Disposition: A | Discharge status: Home / Self Care | Payer: Self-pay | Attending: Emergency Medicine | Admitting: Emergency Medicine

## 2019-10-20 ENCOUNTER — Encounter (HOSPITAL_BASED_OUTPATIENT_CLINIC_OR_DEPARTMENT_OTHER): Payer: Self-pay | Admitting: Emergency Medicine

## 2019-10-20 DIAGNOSIS — F17210 Nicotine dependence, cigarettes, uncomplicated: Secondary | ICD-10-CM | POA: Insufficient documentation

## 2019-10-20 DIAGNOSIS — M7121 Synovial cyst of popliteal space [Baker], right knee: Secondary | ICD-10-CM | POA: Insufficient documentation

## 2019-10-20 DIAGNOSIS — Z79899 Other long term (current) drug therapy: Secondary | ICD-10-CM | POA: Insufficient documentation

## 2019-10-20 DIAGNOSIS — M79604 Pain in right leg: Secondary | ICD-10-CM | POA: Insufficient documentation

## 2019-10-20 MED ORDER — HYDROCODONE-ACETAMINOPHEN 5-325 MG PO TABS
1.0000 | ORAL_TABLET | ORAL | 0 refills | Status: AC | PRN
Start: 2019-10-20 — End: ?

## 2019-10-20 MED FILL — HYDROCODON-APAP 5-325: 5-325 | 3 days supply | Qty: 15 | Fill #0

## 2019-10-20 NOTE — ED Notes (Signed)
ED Provider at bedside. 

## 2019-10-20 NOTE — ED Notes (Signed)
Pt returned from [place], given Coke

## 2019-10-20 NOTE — ED Provider Notes (Signed)
[facility] EMERGENCY DEPARTMENT Provider Note   CSN: [id number] Arrival date & time: 10/20/19  1022     History Chief Complaint  Patient presents with  . Knee Pain    Sarah Hanson is a 48 y.o. female.  HPI  48 year old female presents with right leg pain.  Has been ongoing for about 2 weeks or so.  She has chronic knee problems but feels this is not the knee.  She does have discomfort in the knee but the pain she is talking out is posterior.  Started in her distal thigh/posterior knee and she has some calf pain and she feels like a little bit of swelling.  Has tried NSAIDs without relief.  No weakness/numbness.  No significant swelling of the knee. Is painful to keep straight and feels better with knee/hips flexed.  Past Medical History:  Diagnosis Date  . Anxiety    no med at this time--pt quit due to $  . Arthritis   . Bipolar disorder ([facility])    bipolar 1 - not on medication  . ETOH abuse   . History of kidney stones   . Marijuana abuse     Patient Active Problem List   Diagnosis Date Noted  . Arthrofibrosis of left total knee arthroplasty, initial encounter ([facility]) 07/02/2017  . Status post total knee replacement, left 04/14/2017  . Unilateral primary osteoarthritis, left knee 02/23/2017  . Left knee pain 12/15/2016    Past Surgical History:  Procedure Laterality Date  . CESAREAN SECTION    . CESAREAN SECTION     x2  . KNEE CLOSED REDUCTION Left 07/02/2017   Procedure: CLOSED MANIPULATION LEFT KNEE;  Surgeon: Kathryne Hitch, MD;  Location: [facility];  Service: Orthopedics;  Laterality: Left;  . TOTAL KNEE ARTHROPLASTY Left 04/14/2017   Procedure: LEFT TOTAL KNEE ARTHROPLASTY;  Surgeon: Kathryne Hitch, MD;  Location: [facility];  Service: Orthopedics;  Laterality: Left;  . TUBAL LIGATION       OB History   No obstetric history on file.     Family History  Problem Relation Age of Onset  . Bipolar disorder Mother   . Kidney failure Mother    . COPD Mother     Social History   Tobacco Use  . Smoking status: Current Every Day Smoker    Packs/day: 1.00    Years: 30.00    Pack years: 30.00    Types: Cigarettes  . Smokeless tobacco: Never Used  Substance Use Topics  . Alcohol use: Yes    Comment: 2 weeks- beer heavy drinker in the past  . Drug use: Yes    Types: Marijuana    Comment: 2 weeks ago    Home Medications Prior to Admission medications   Medication Sig Start Date End Date Taking? Authorizing Provider  acetaminophen (TYLENOL) 650 MG CR tablet Take 650 mg by mouth 3 (three) times daily as needed for pain.    [provider]  gabapentin (NEURONTIN) 300 MG capsule Take 1 capsule (300 mg total) by mouth daily. 10/06/17   Kathryne Hitch, MD  HYDROcodone-acetaminophen (NORCO) 5-325 MG tablet Take 1 tablet by mouth every 4 (four) hours as needed for severe pain. 10/20/19   Pricilla Loveless, MD  ibuprofen (ADVIL,MOTRIN) 200 MG tablet Take 400 mg by mouth 3 (three) times daily as needed for mild pain.    [provider]  naproxen (NAPROSYN) 500 MG tablet Take 1 tablet (500 mg total) by mouth 2 (two) times  daily with a meal. 10/10/19   Caccavale, Sophia, PA-C  oxyCODONE-acetaminophen (ROXICET) 5-325 MG tablet Take 1-2 tablets by mouth every 8 (eight) hours as needed. 07/02/17   Mcarthur Rossetti, MD    Allergies    No known allergies  Review of Systems   Review of Systems  Musculoskeletal: Positive for myalgias. Negative for joint swelling.  Neurological: Negative for weakness and numbness.  All other systems reviewed and are negative.   Physical Exam Updated Vital Signs BP (!) 151/84 (BP Location: Right Arm)   Pulse 65   Temp 98.2 F (36.8 C) (Oral)   Resp 16   LMP 10/15/2019   SpO2 100%   Physical Exam Vitals and nursing note reviewed.  Constitutional:      Appearance: She is well-developed.  HENT:     Head: Normocephalic and atraumatic.     Right Ear: External ear  normal.     Left Ear: External ear normal.     Nose: Nose normal.  Eyes:     General:        Right eye: No discharge.        Left eye: No discharge.  Cardiovascular:     Rate and Rhythm: Normal rate and regular rhythm.     Pulses:          Dorsalis pedis pulses are 2+ on the right side and 2+ on the left side.  Pulmonary:     Effort: Pulmonary effort is normal.  Abdominal:     General: There is no distension.     Palpations: Abdomen is soft.  Musculoskeletal:     Right knee: Normal range of motion. No tenderness.     Right lower leg: Tenderness (mild, calf) present.     Comments: The right calf looks slightly enlarged compared to left. No pitting edema. No focal tenderness point. No redness. Normal ROM of knee, no effusion/erythema.  Skin:    General: Skin is warm and dry.  Neurological:     Mental Status: She is alert.  Psychiatric:        Mood and Affect: Mood is not anxious.     ED Results / Procedures / Treatments   Labs (all labs ordered are listed, but only abnormal results are displayed) Labs Reviewed - No data to display  EKG None  Radiology US Venous Img Lower Right (DVT Study)  Result Date: 10/20/2019 CLINICAL DATA:  48 year old female with a history of leg pain EXAM: RIGHT LOWER EXTREMITY VENOUS DOPPLER ULTRASOUND TECHNIQUE: Gray-scale sonography with graded compression, as well as color Doppler and duplex ultrasound were performed to evaluate the lower extremity deep venous systems from the level of the common femoral vein and including the common femoral, femoral, profunda femoral, popliteal and calf veins including the posterior tibial, peroneal and gastrocnemius veins when visible. The superficial great saphenous vein was also interrogated. Spectral Doppler was utilized to evaluate flow at rest and with distal augmentation maneuvers in the common femoral, femoral and popliteal veins. COMPARISON:  None. FINDINGS: Contralateral Common Femoral Vein: Respiratory  phasicity is normal and symmetric with the symptomatic side. No evidence of thrombus. Normal compressibility. Common Femoral Vein: No evidence of thrombus. Normal compressibility, respiratory phasicity and response to augmentation. Saphenofemoral Junction: No evidence of thrombus. Normal compressibility and flow on color Doppler imaging. Profunda Femoral Vein: No evidence of thrombus. Normal compressibility and flow on color Doppler imaging. Femoral Vein: No evidence of thrombus. Normal compressibility, respiratory phasicity and response to augmentation. Popliteal Vein: No  evidence of thrombus. Normal compressibility, respiratory phasicity and response to augmentation. Calf Veins: No evidence of thrombus. Normal compressibility and flow on color Doppler imaging. Superficial Great Saphenous Vein: No evidence of thrombus. Normal compressibility and flow on color Doppler imaging. Other Findings: Lentiform anechoic fluid collection in the popliteal region measures as large as 1.9 cm x 3.1 cm, compatible with Baker's cyst IMPRESSION: Sonographic survey of the right lower extremity negative for DVT Electronically Signed   By: Gilmer MorJaime  Wagner D.O.   On: 10/20/2019 13:16    Procedures Procedures (including critical care time)  Medications Ordered in ED Medications - No data to display  ED Course  I have reviewed the triage vital signs and the nursing notes.  Pertinent labs & imaging results that were available during my care of the patient were reviewed by me and considered in my medical decision making (see chart for details).    MDM Rules/Calculators/A&P                      Patient's ultrasound shows no DVT but does show Baker's cyst, which is likely the cause of her pain.  No signs of an obvious infection such as cellulitis over the spot.  She is having intractable pain despite NSAIDs.  Advised to continue NSAIDs and will give short course of Norco.  Otherwise appears stable for outpatient orthopedics  follow-up. Final Clinical Impression(s) / ED Diagnoses Final diagnoses:  Baker's cyst of knee, right    Rx / DC Orders ED Discharge Orders         Ordered    HYDROcodone-acetaminophen (NORCO) 5-325 MG tablet  Every 4 hours PRN     10/20/19 1335           Pricilla LovelessGoldston, Tameia Rafferty, MD 10/20/19 1336

## 2019-10-20 NOTE — ED Triage Notes (Signed)
Right knee pain since 10/10/19 which is progressing.  Pt states its worsening, radiating and pain to right calf.

## 2019-10-25 ENCOUNTER — Ambulatory Visit: Payer: Self-pay | Admitting: Family Medicine

## 2019-10-27 ENCOUNTER — Ambulatory Visit: Payer: Self-pay | Admitting: Family Medicine

## 2019-11-01 ENCOUNTER — Ambulatory Visit: Payer: Self-pay | Admitting: Family Medicine

## 2019-11-21 ENCOUNTER — Ambulatory Visit: Payer: Self-pay

## 2019-11-21 ENCOUNTER — Other Ambulatory Visit: Payer: Self-pay

## 2019-11-21 ENCOUNTER — Ambulatory Visit (INDEPENDENT_AMBULATORY_CARE_PROVIDER_SITE_OTHER): Payer: Self-pay | Admitting: Family Medicine

## 2019-11-21 ENCOUNTER — Encounter: Payer: Self-pay | Admitting: Family Medicine

## 2019-11-21 DIAGNOSIS — M25561 Pain in right knee: Secondary | ICD-10-CM

## 2019-11-21 DIAGNOSIS — G8929 Other chronic pain: Secondary | ICD-10-CM

## 2019-11-21 DIAGNOSIS — M1712 Unilateral primary osteoarthritis, left knee: Secondary | ICD-10-CM

## 2019-11-21 MED ORDER — TRAMADOL HCL 50 MG PO TABS: 50.0000 mg | ORAL_TABLET | Freq: Four times a day (QID) | ORAL | 0 refills | Status: DC | PRN

## 2019-11-21 NOTE — Progress Notes (Signed)
Office Visit Note   Patient: Sarah Hanson           Date of Birth: [birth year]           MRN: [id number] Visit Date: 11/21/2019 Requested by: No referring provider defined for this encounter. PCP: Patient, [name]  Subjective: Chief Complaint  Patient presents with  . Right Knee - Pain    Right knee pain. Known Baker's cyst. Works on her feet all day at [employer]. H/o TKR on the left. She says Sarah Hanson told her the right knee would also need replacement in the future. Constant pain medial aspect.    HPI: She is here with right knee pain.  Pain for several years, getting steadily worse.  She has been to the ER and had x-rays showing moderate tricompartmental DJD.  She also had a Doppler showing a small 2 x 3 cm Baker's cyst.  She is status post left knee replacement a few years ago and has done well from that standpoint but had a difficult recovery.  She is trying to avoid surgery.  She is never had an injection in the right knee and wonders whether it might help.  She works at [employer] and is on her feet a lot.              ROS: No fevers or chills.  All other systems were reviewed and are negative.  Objective: Vital Signs: There were no vitals taken for this visit.  Physical Exam:  General:  Alert and oriented, in no acute distress. Pulm:  Breathing unlabored. Psy:  Normal mood, congruent affect. Skin: No rash or erythema. Right knee: 1+ effusion with no warmth.  Full active extension, flexion of 120 degrees.  2+ patellofemoral crepitus.  Ligaments are stable.  Very tender in the medial joint line.  No palpable click with McMurray's.  Very slight tenderness in the popliteal fossa.  Imaging: None today  Assessment & Plan: 1.  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  Small Baker's cyst but that does not seem to be the source of her pain. -Discussed options with her and elected to inject the knee with cortisone today.  If this  gives only temporary relief, could try to get approval for gel injections.     Procedures: Right knee steroid injection: After sterile prep with Betadine, injected 3 cc 1% lidocaine without epinephrine and 40 mg methylprednisolone from superolateral approach, a flash of clear yellow synovial fluid was obtained prior to injection.    PMFS History: Patient Active Problem List   Diagnosis Date Noted  . Arthrofibrosis of left total knee arthroplasty, initial encounter (HCC) 07/02/2017  . Status post total knee replacement, left 04/14/2017  . Unilateral primary osteoarthritis, left knee 02/23/2017  . Left knee pain 12/15/2016   Past Medical History:  Diagnosis Date  . Anxiety    no med at this time--pt quit due to $  . Arthritis   . Bipolar disorder (HCC)    bipolar 1 - not on medication  . ETOH abuse   . History of kidney stones   . Marijuana abuse     Family History  Problem Relation Age of Onset  . Bipolar disorder Mother   . Kidney failure Mother   . COPD Mother     Past Surgical History:  Procedure Laterality Date  . CESAREAN SECTION    . CESAREAN SECTION     x2  .  KNEE CLOSED REDUCTION Left 07/02/2017   Procedure: CLOSED MANIPULATION LEFT KNEE;  Surgeon: Sarah Rossetti, MD;  Location: [place];  Service: Orthopedics;  Laterality: Left;  . TOTAL KNEE ARTHROPLASTY Left 04/14/2017   Procedure: LEFT TOTAL KNEE ARTHROPLASTY;  Surgeon: Sarah Rossetti, MD;  Location: [place];  Service: Orthopedics;  Laterality: Left;  . TUBAL LIGATION     Social History   Occupational History  . Not on file  Tobacco Use  . Smoking status: Current Every Day Smoker    Packs/day: 1.00    Years: 30.00    Pack years: 30.00    Types: Cigarettes  . Smokeless tobacco: Never Used  Substance and Sexual Activity  . Alcohol use: Yes    Comment: 2 weeks- beer heavy drinker in the past  . Drug use: Yes    Types: Marijuana    Comment: 2 weeks ago  . Sexual activity: Not on file

## 2020-02-08 ENCOUNTER — Ambulatory Visit: Payer: Self-pay | Admitting: Family Medicine

## 2020-03-07 ENCOUNTER — Telehealth: Payer: Self-pay | Admitting: Family Medicine

## 2020-03-07 MED ORDER — TRAMADOL HCL 50 MG PO TABS: 50.0000 mg | ORAL_TABLET | Freq: Four times a day (QID) | ORAL | 0 refills | Status: DC | PRN

## 2020-03-07 MED ORDER — GABAPENTIN 300 MG PO CAPS: 300.0000 mg | ORAL_CAPSULE | Freq: Every day | ORAL | 1 refills | Status: DC

## 2020-03-07 MED ORDER — DICLOFENAC SODIUM 75 MG PO TBEC: 75.0000 mg | DELAYED_RELEASE_TABLET | Freq: Two times a day (BID) | ORAL | 3 refills | Status: DC | PRN

## 2020-03-07 NOTE — Telephone Encounter (Signed)
Patient called stating that she is not able to have her knee surgery just yet and requested an RX to be called into [facility] on [address] in [place] for Gabapentin, Tramadol, and Diclofenac.  CB#[phone].  Thank you.

## 2020-03-07 NOTE — Telephone Encounter (Signed)
Please advise. You last saw this patient and prescribed Tramadol. The Gabapentin and Diclofenac were previously from Dr. Temple Pacini. Please advise.

## 2020-03-07 NOTE — Telephone Encounter (Signed)
Sent!

## 2020-03-07 NOTE — Telephone Encounter (Signed)
Called and left a voice mail that the 3 medications were all refilled.

## 2020-04-15 ENCOUNTER — Other Ambulatory Visit: Payer: Self-pay

## 2020-04-15 ENCOUNTER — Emergency Department (HOSPITAL_BASED_OUTPATIENT_CLINIC_OR_DEPARTMENT_OTHER)
Admission: EM | Admit: 2020-04-15 | Discharge: 2020-04-15 | Disposition: A | Discharge status: Home / Self Care | Payer: Self-pay | Attending: Emergency Medicine | Admitting: Emergency Medicine

## 2020-04-15 ENCOUNTER — Encounter (HOSPITAL_BASED_OUTPATIENT_CLINIC_OR_DEPARTMENT_OTHER): Payer: Self-pay | Admitting: Emergency Medicine

## 2020-04-15 DIAGNOSIS — A599 Trichomoniasis, unspecified: Secondary | ICD-10-CM | POA: Insufficient documentation

## 2020-04-15 DIAGNOSIS — R21 Rash and other nonspecific skin eruption: Secondary | ICD-10-CM

## 2020-04-15 DIAGNOSIS — F17210 Nicotine dependence, cigarettes, uncomplicated: Secondary | ICD-10-CM | POA: Insufficient documentation

## 2020-04-15 DIAGNOSIS — B356 Tinea cruris: Secondary | ICD-10-CM | POA: Insufficient documentation

## 2020-04-15 DIAGNOSIS — Z96652 Presence of left artificial knee joint: Secondary | ICD-10-CM | POA: Insufficient documentation

## 2020-04-15 LAB — WET PREP, GENITAL
Clue Cells Wet Prep HPF POC: NONE SEEN
Sperm: NONE SEEN
Yeast Wet Prep HPF POC: NONE SEEN

## 2020-04-15 LAB — URINALYSIS, ROUTINE W REFLEX MICROSCOPIC
Bilirubin Urine: NEGATIVE
Glucose, UA: NEGATIVE mg/dL
Ketones, ur: NEGATIVE mg/dL
Nitrite: NEGATIVE
Protein, ur: NEGATIVE mg/dL
Specific Gravity, Urine: 1.025 (ref 1.005–1.030)
pH: 6 (ref 5.0–8.0)

## 2020-04-15 LAB — URINALYSIS, MICROSCOPIC (REFLEX)

## 2020-04-15 LAB — PREGNANCY, URINE: Preg Test, Ur: NEGATIVE

## 2020-04-15 MED ORDER — METRONIDAZOLE 500 MG PO TABS
2000.0000 mg | ORAL_TABLET | Freq: Once | ORAL | Status: AC
Start: 2020-04-15 — End: 2020-04-15
  Administered 2020-04-15: 15:00:00 2000 mg via ORAL
  Filled 2020-04-15: qty 4

## 2020-04-15 MED ORDER — CLOTRIMAZOLE 1 % EX CREA: TOPICAL_CREAM | CUTANEOUS | 0 refills | Status: AC

## 2020-04-15 NOTE — Discharge Instructions (Signed)
Your test today came back positive for trichomonas.  This was treated in the ED.  You need to abstain from sex for the next week to prevent reinfection. Use Tylenol or ibuprofen as needed for lower abdominal discomfort. Your test for gonorrhea and chlamydia are pending.  If positive, you will see a phone call and will need treatment.  You can follow-up with health department for this. If negative, you will not receive a phone call.  Either way, you may check online on MyChart. Use the cream on your thighs to decrease irritation. Follow-up with a primary care doctor as needed for further evaluation. Return to the emergency room with any new, worsening, concerning symptoms.

## 2020-04-15 NOTE — ED Triage Notes (Signed)
Pt reports vaginal itching, dc and odor x over 1 wk; no relief with Monistat

## 2020-04-15 NOTE — ED Provider Notes (Signed)
[facility] EMERGENCY DEPARTMENT Provider Note   CSN: [id number] Arrival date & time: 04/15/20  1032     History Chief Complaint  Patient presents with  . Vaginal Itching    Sarah Hanson is a 49 y.o. female presenting for evaluation of vaginal irritation and suprapubic pressure.  Patient states that the past 10 days, she has had persistent symptoms.  She reports constant suprapubic pressure, as well as vaginal irritation.  She reports a mild odor and minimal discharge.  She thought was a yeast infection, treated with Monistat for a week without improvement of symptoms.  She has tried to see the health department, but been unable to get in.  She denies fevers, chills, nausea, vomiting, abdominal pain.  She denies dysuria, hematuria, or urinary frequency. Patient initially states she is not sexually active, and has not been for a long time.  She states she has no concerns for STIs.  She later states she has been sexually active with 1 person recently.  Additional history taken chart review.  Patient with a history of anxiety, arthritis, bipolar, alcohol abuse  HPI     Past Medical History:  Diagnosis Date  . Anxiety    no med at this time--pt quit due to $  . Arthritis   . Bipolar disorder (HCC)    bipolar 1 - not on medication  . ETOH abuse   . History of kidney stones   . Marijuana abuse     Patient Active Problem List   Diagnosis Date Noted  . Arthrofibrosis of left total knee arthroplasty, initial encounter (HCC) 07/02/2017  . Status post total knee replacement, left 04/14/2017  . Unilateral primary osteoarthritis, left knee 02/23/2017  . Left knee pain 12/15/2016    Past Surgical History:  Procedure Laterality Date  . CESAREAN SECTION    . CESAREAN SECTION     x2  . KNEE CLOSED REDUCTION Left 07/02/2017   Procedure: CLOSED MANIPULATION LEFT KNEE;  Surgeon: Kathryne Hitch, MD;  Location: [facility];  Service: Orthopedics;  Laterality: Left;  .  TOTAL KNEE ARTHROPLASTY Left 04/14/2017   Procedure: LEFT TOTAL KNEE ARTHROPLASTY;  Surgeon: Kathryne Hitch, MD;  Location: [facility];  Service: Orthopedics;  Laterality: Left;  . TUBAL LIGATION       OB History   No obstetric history on file.     Family History  Problem Relation Age of Onset  . Bipolar disorder Mother   . Kidney failure Mother   . COPD Mother     Social History   Tobacco Use  . Smoking status: Current Every Day Smoker    Packs/day: 1.00    Years: 30.00    Pack years: 30.00    Types: Cigarettes  . Smokeless tobacco: Never Used  Vaping Use  . Vaping Use: Never used  Substance Use Topics  . Alcohol use: Not Currently  . Drug use: Not Currently    Home Medications Prior to Admission medications   Medication Sig Start Date End Date Taking? Authorizing Provider  acetaminophen (TYLENOL) 650 MG CR tablet Take 650 mg by mouth 3 (three) times daily as needed for pain.    [provider]  clotrimazole (LOTRIMIN) 1 % cream Apply to affected area 2 times daily 04/15/20   Singleton Hickox, PA-C  diclofenac (VOLTAREN) 75 MG EC tablet Take 1 tablet (75 mg total) by mouth 2 (two) times daily as needed. 03/07/20   Hilts, Casimiro Needle, MD  gabapentin (NEURONTIN) 300 MG capsule  Take 1 capsule (300 mg total) by mouth daily. 03/07/20   Hilts, Casimiro Needle, MD  HYDROcodone-acetaminophen (NORCO) 5-325 MG tablet Take 1 tablet by mouth every 4 (four) hours as needed for severe pain. Patient not taking: Reported on 11/21/2019 10/20/19   Pricilla Loveless, MD  ibuprofen (ADVIL,MOTRIN) 200 MG tablet Take 400 mg by mouth 3 (three) times daily as needed for mild pain.    [provider]  naproxen (NAPROSYN) 500 MG tablet Take 1 tablet (500 mg total) by mouth 2 (two) times daily with a meal. Patient not taking: Reported on 11/21/2019 10/10/19   Cecilia Nishikawa, PA-C  oxyCODONE-acetaminophen (ROXICET) 5-325 MG tablet Take 1-2 tablets by mouth every 8 (eight) hours as  needed. Patient not taking: Reported on 11/21/2019 07/02/17   Kathryne Hitch, MD  traMADol (ULTRAM) 50 MG tablet Take 1 tablet (50 mg total) by mouth every 6 (six) hours as needed. 03/07/20   Hilts, Casimiro Needle, MD    Allergies    No known allergies  Review of Systems   Review of Systems  Genitourinary:       Suprapubic pressure  Skin:       Groin rash    Physical Exam Updated Vital Signs BP (!) 180/96 (BP Location: Right Arm)   Pulse 90   Temp 98 F (36.7 C) (Oral)   Resp 18   Ht 5\' 4"  (1.626 m)   Wt 90.7 kg   LMP 03/20/2020   SpO2 100%   BMI 34.33 kg/m   Physical Exam Vitals and nursing note reviewed. Exam conducted with a chaperone present.  Constitutional:      General: She is not in acute distress.    Appearance: She is well-developed.     Comments: Resting in the bed in no acute distress  HENT:     Head: Normocephalic and atraumatic.  Eyes:     Conjunctiva/sclera: Conjunctivae normal.     Pupils: Pupils are equal, round, and reactive to light.  Cardiovascular:     Rate and Rhythm: Normal rate and regular rhythm.     Pulses: Normal pulses.  Pulmonary:     Effort: Pulmonary effort is normal. No respiratory distress.     Breath sounds: Normal breath sounds. No wheezing.  Abdominal:     General: There is no distension.     Palpations: Abdomen is soft. There is no mass.     Tenderness: There is no abdominal tenderness. There is no guarding or rebound.     Comments: No tenderness palpation of the abdomen  Genitourinary:    Cervix: Discharge present. No cervical motion tenderness or friability.     Uterus: Normal.      Adnexa: Right adnexa normal and left adnexa normal.       Comments: Thick yellow discharge noted on exam.  No CMT or adnexal tenderness. Skin rash of bilateral inner upper thighs Musculoskeletal:        General: Normal range of motion.     Cervical back: Normal range of motion and neck supple.  Skin:    General: Skin is warm and dry.   Neurological:     Mental Status: She is alert and oriented to person, place, and time.     ED Results / Procedures / Treatments   Labs (all labs ordered are listed, but only abnormal results are displayed) Labs Reviewed  WET PREP, GENITAL - Abnormal; Notable for the following components:      Result Value   Trich, Wet Prep PRESENT (*)  WBC, Wet Prep HPF POC MANY (*)    All other components within normal limits  URINALYSIS, ROUTINE W REFLEX MICROSCOPIC - Abnormal; Notable for the following components:   APPearance CLOUDY (*)    Hgb urine dipstick MODERATE (*)    Leukocytes,Ua MODERATE (*)    All other components within normal limits  URINALYSIS, MICROSCOPIC (REFLEX) - Abnormal; Notable for the following components:   Bacteria, UA MANY (*)    Trichomonas, UA PRESENT (*)    All other components within normal limits  PREGNANCY, URINE  GC/CHLAMYDIA PROBE AMP ([place]) NOT AT [facility]    EKG None  Radiology No results found.  Procedures Procedures (including critical care time)  Medications Ordered in ED Medications  metroNIDAZOLE (FLAGYL) tablet 2,000 mg (2,000 mg Oral Given 04/15/20 1433)    ED Course  I have reviewed the triage vital signs and the nursing notes.  Pertinent labs & imaging results that were available during my care of the patient were reviewed by me and considered in my medical decision making (see chart for details).    MDM Rules/Calculators/A&P                          Patient presenting for evaluation of suprapubic pressure, vaginal irritation, and vaginal discharge.  On exam, patient has thick yellow discharge around the cervix without CMT or adnexal tenderness.  She also has a rash of bilateral upper inner thighs.  Will obtain urine, wet prep, gonorrhea and chlamydia for further evaluation.  Urine and wet prep both positive for trichomoniasis. Will tx with flagyl. UA with many squamous cells, does show many bacteria and moderate leuks, however  in the setting of no urinary symptoms and a positive trichomoniasis, likely the cause for pts many bacteria. Will not tx asymptomatic uti. Clotrimazole for rash, consider tinea curis. Pt aware gc/cl sample is pending. At this time, pt appears safe for d/c. Return precautions given. Pt states she understands and agrees to plan.   Final Clinical Impression(s) / ED Diagnoses Final diagnoses:  Trichomoniasis  Groin rash    Rx / DC Orders ED Discharge Orders         Ordered    clotrimazole (LOTRIMIN) 1 % cream     Discontinue  Reprint     04/15/20 [place], Kalsey Lull, PA-C 04/15/20 Lolita, Cressey, DO 04/16/20 0708

## 2020-04-16 LAB — GC/CHLAMYDIA PROBE AMP (~~LOC~~) NOT AT ARMC
Chlamydia: NEGATIVE
Comment: NEGATIVE
Comment: NORMAL
Neisseria Gonorrhea: NEGATIVE

## 2020-09-03 ENCOUNTER — Telehealth: Payer: Self-pay

## 2020-09-03 MED ORDER — GABAPENTIN 300 MG PO CAPS: 300.0000 mg | ORAL_CAPSULE | Freq: Every day | ORAL | 1 refills | Status: AC

## 2020-09-03 MED ORDER — TRAMADOL HCL 50 MG PO TABS: 50.0000 mg | ORAL_TABLET | Freq: Four times a day (QID) | ORAL | 0 refills | Status: AC | PRN

## 2020-09-03 MED ORDER — DICLOFENAC SODIUM 75 MG PO TBEC: 75.0000 mg | DELAYED_RELEASE_TABLET | Freq: Two times a day (BID) | ORAL | 3 refills | Status: AC | PRN

## 2020-09-03 NOTE — Telephone Encounter (Signed)
Patient called she is requesting rx to be called into [facility] on [address] in [place] for Gabapentin, Tramadol, and Diclofenac.  CB#[phone]

## 2020-09-03 NOTE — Telephone Encounter (Signed)
Please advise 

## 2020-09-03 NOTE — Telephone Encounter (Signed)
Sent!

## 2020-09-03 NOTE — Addendum Note (Signed)
Addended by: Lillia Carmel on: 09/03/2020 01:45 PM   Modules accepted: Orders

## 2020-09-03 NOTE — Telephone Encounter (Signed)
Tried to call the patient to advise her these were sent in - no answer. She can check with the pharmacy later.

## 2022-09-10 ENCOUNTER — Encounter (HOSPITAL_BASED_OUTPATIENT_CLINIC_OR_DEPARTMENT_OTHER): Payer: Self-pay | Admitting: Emergency Medicine

## 2022-09-10 ENCOUNTER — Emergency Department (HOSPITAL_BASED_OUTPATIENT_CLINIC_OR_DEPARTMENT_OTHER): Payer: Self-pay

## 2022-09-10 ENCOUNTER — Other Ambulatory Visit: Payer: Self-pay

## 2022-09-10 ENCOUNTER — Emergency Department (HOSPITAL_BASED_OUTPATIENT_CLINIC_OR_DEPARTMENT_OTHER)
Admission: EM | Admit: 2022-09-10 | Discharge: 2022-09-10 | Disposition: A | Discharge status: Home / Self Care | Payer: Self-pay | Attending: Emergency Medicine | Admitting: Emergency Medicine

## 2022-09-10 DIAGNOSIS — M25521 Pain in right elbow: Secondary | ICD-10-CM | POA: Insufficient documentation

## 2022-09-10 DIAGNOSIS — M25561 Pain in right knee: Secondary | ICD-10-CM | POA: Insufficient documentation

## 2022-09-10 MED ORDER — IBUPROFEN 400 MG PO TABS
600.0000 mg | ORAL_TABLET | Freq: Once | ORAL | Status: AC
  Administered 2022-09-10: 600 mg via ORAL
  Filled 2022-09-10: qty 1

## 2022-09-10 NOTE — ED Triage Notes (Signed)
Right elbow swelling and pain x 1 week. Unrelieved with RICE. Denies injury.  Constant throbbing.

## 2022-09-10 NOTE — Discharge Instructions (Signed)
Evaluation for your elbow pain was overall reassuring.  X-ray did not reveal concern for fracture or dislocation.  Recommend that he continue conservative treatment at home with rest ice, compression and elevation to reduce any swelling.  You likely sustained a soft tissue injury of some kind that is contributing to your symptoms.  You can take Tylenol and ibuprofen as needed for pain.  Recommend that you follow-up with your PCP for your symptoms.

## 2022-09-10 NOTE — ED Notes (Signed)
Patient was given a knee immobilizer for her right elbow. Patient stated it felt good and was comfortable wearing same. She states she had relief with some pain and has good PMS in the distal extremity.

## 2022-09-10 NOTE — ED Provider Notes (Signed)
[facility] EMERGENCY DEPARTMENT Provider Note   CSN: [id number] Arrival date & time: 09/10/22  1556     History  Chief Complaint  Patient presents with   Joint Swelling   HPI Sarah Hanson is a 51 y.o. female with bipolar disorder, alcohol and marijuana abuse, and arthritis presenting for right elbow pain.  Started about a week ago.  Patient unsure if she sustained any trauma to the right elbow.  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  Right elbow is painful and swollen.  Hurts worse with extension and flexion of the elbow.  Pain feels constant and throbbing.  Has taken ibuprofen and Tylenol along with cold and warm compresses for pain and swelling.   HPI     Home Medications Prior to Admission medications   Medication Sig Start Date End Date Taking? Authorizing Provider  acetaminophen (TYLENOL) 650 MG CR tablet Take 650 mg by mouth 3 (three) times daily as needed for pain.    [provider]  clotrimazole (LOTRIMIN) 1 % cream Apply to affected area 2 times daily 04/15/20   Caccavale, Sophia, PA-C  diclofenac (VOLTAREN) 75 MG EC tablet Take 1 tablet (75 mg total) by mouth 2 (two) times daily as needed. 09/03/20   Hilts, Casimiro Needle, MD  gabapentin (NEURONTIN) 300 MG capsule Take 1 capsule (300 mg total) by mouth daily. 09/03/20   Hilts, Casimiro Needle, MD  HYDROcodone-acetaminophen (NORCO) 5-325 MG tablet Take 1 tablet by mouth every 4 (four) hours as needed for severe pain. Patient not taking: Reported on 11/21/2019 10/20/19   Pricilla Loveless, MD  ibuprofen (ADVIL,MOTRIN) 200 MG tablet Take 400 mg by mouth 3 (three) times daily as needed for mild pain.    [provider]  naproxen (NAPROSYN) 500 MG tablet Take 1 tablet (500 mg total) by mouth 2 (two) times daily with a meal. Patient not taking: Reported on 11/21/2019 10/10/19   Caccavale, Sophia, PA-C  oxyCODONE-acetaminophen (ROXICET) 5-325 MG  tablet Take 1-2 tablets by mouth every 8 (eight) hours as needed. Patient not taking: Reported on 11/21/2019 07/02/17   Kathryne Hitch, MD  traMADol (ULTRAM) 50 MG tablet Take 1 tablet (50 mg total) by mouth every 6 (six) hours as needed. 09/03/20   Hilts, Casimiro Needle, MD      Allergies    No known allergies    Review of Systems   Review of Systems  Musculoskeletal:        Right elbow pain    Physical Exam Updated Vital Signs BP (!) 152/86 (BP Location: Left Arm)   Pulse 69   Temp 97.7 F (36.5 C)   Resp 18   Ht 5\' 4"  (1.626 m)   Wt 90.3 kg   LMP 08/25/2022 (Approximate)   SpO2 99%   BMI 34.16 kg/m  Physical Exam Constitutional:      Appearance: Normal appearance.  HENT:     Head: Normocephalic.     Nose: Nose normal.  Eyes:     Conjunctiva/sclera: Conjunctivae normal.  Pulmonary:     Effort: Pulmonary effort is normal.  Musculoskeletal:     Right elbow: No swelling, deformity or effusion. Normal range of motion. Tenderness present in olecranon process.  Neurological:     Mental Status: She is alert.  Psychiatric:        Mood and Affect: Mood normal.     ED Results / Procedures / Treatments  Labs (all labs ordered are listed, but only abnormal results are displayed) Labs Reviewed - No data to display  EKG None  Radiology DG Elbow Complete Right  Result Date: 09/10/2022 CLINICAL DATA:  Right elbow pain and swelling EXAM: RIGHT ELBOW - COMPLETE 3+ VIEW COMPARISON:  Right forearm radiographs of 06/16/2021 FINDINGS: Mild spurring of the coronoid process, especially along the sublime tubercle. Suboptimal off axis lateral view attempt, indeterminate for elbow joint effusion. No fracture identified. IMPRESSION: 1. Mild spurring of the coronoid process, especially along the sublime tubercle. 2. Suboptimal off axis lateral view attempt, indeterminate for elbow joint effusion. 3. No visible fracture. Electronically Signed   By: Gaylyn Rong M.D.   On:  09/10/2022 16:40    Procedures Procedures    Medications Ordered in ED Medications  ibuprofen (ADVIL) tablet 600 mg (600 mg Oral Given 09/10/22 1701)    ED Course/ Medical Decision Making/ A&P                           Medical Decision Making  Patient presented for right elbow pain.  Differential includes elbow dislocation, elbow fracture, effusion.  Exam is overall reassuring but did reveal some tenderness over the olecranon process.  I reviewed and interpreted elbow x-ray which was negative for fracture or dislocation but indeterminate for joint effusion.  Considered elbow dislocation and fracture but unlikely given x-ray. If there is an effusion it is likely small time is not warranted aspiration given that patient only endorses mild tenderness with normal range of motion of her elbow.  The patient at high risk for infection.  Advised patient that she continue with conservative treatment at home follow-up with her PCP.        Final Clinical Impression(s) / ED Diagnoses Final diagnoses:  Right elbow pain    Rx / DC Orders ED Discharge Orders     None         Gareth Eagle, PA-C 09/10/22 1740    Vanetta Mulders, MD 09/18/22 [phone]

## 2022-11-08 ENCOUNTER — Encounter (HOSPITAL_BASED_OUTPATIENT_CLINIC_OR_DEPARTMENT_OTHER): Payer: Self-pay

## 2022-11-08 ENCOUNTER — Other Ambulatory Visit: Payer: Self-pay

## 2022-11-08 DIAGNOSIS — N898 Other specified noninflammatory disorders of vagina: Secondary | ICD-10-CM | POA: Insufficient documentation

## 2022-11-08 DIAGNOSIS — Z5321 Procedure and treatment not carried out due to patient leaving prior to being seen by health care provider: Secondary | ICD-10-CM | POA: Insufficient documentation

## 2022-11-08 LAB — URINALYSIS, MICROSCOPIC (REFLEX)

## 2022-11-08 LAB — URINALYSIS, ROUTINE W REFLEX MICROSCOPIC
Bilirubin Urine: NEGATIVE
Glucose, UA: NEGATIVE mg/dL
Ketones, ur: NEGATIVE mg/dL
Nitrite: NEGATIVE
Protein, ur: 30 mg/dL — AB
Specific Gravity, Urine: >=1.03 (ref 1.005–1.030)
pH: 6 (ref 5.0–8.0)

## 2022-11-08 LAB — PREGNANCY, URINE: Preg Test, Ur: NEGATIVE

## 2022-11-08 NOTE — ED Triage Notes (Signed)
Pt presents with c/o vaginal infection. Pt reports having intercourse with person that had previously given her trichomonis and this feels the same.

## 2022-11-09 ENCOUNTER — Emergency Department (HOSPITAL_BASED_OUTPATIENT_CLINIC_OR_DEPARTMENT_OTHER)
Admission: EM | Admit: 2022-11-09 | Discharge: 2022-11-09 | Disposition: A | Discharge status: Home / Self Care | Payer: Self-pay | Attending: Emergency Medicine | Admitting: Emergency Medicine

## 2022-11-09 NOTE — ED Notes (Signed)
Called patient twice with no answer.

## 2023-01-27 DIAGNOSIS — Z6832 Body mass index (BMI) 32.0-32.9, adult: Secondary | ICD-10-CM | POA: Diagnosis not present

## 2023-01-27 DIAGNOSIS — A599 Trichomoniasis, unspecified: Secondary | ICD-10-CM | POA: Diagnosis not present

## 2023-01-27 DIAGNOSIS — N898 Other specified noninflammatory disorders of vagina: Secondary | ICD-10-CM | POA: Diagnosis not present

## 2023-01-27 DIAGNOSIS — R300 Dysuria: Secondary | ICD-10-CM | POA: Diagnosis not present

## 2023-01-27 DIAGNOSIS — Z114 Encounter for screening for human immunodeficiency virus [HIV]: Secondary | ICD-10-CM | POA: Diagnosis not present

## 2023-01-27 DIAGNOSIS — R030 Elevated blood-pressure reading, without diagnosis of hypertension: Secondary | ICD-10-CM | POA: Diagnosis not present

## 2023-01-27 DIAGNOSIS — Z7251 High risk heterosexual behavior: Secondary | ICD-10-CM | POA: Diagnosis not present

## 2023-01-27 DIAGNOSIS — Z1231 Encounter for screening mammogram for malignant neoplasm of breast: Secondary | ICD-10-CM | POA: Diagnosis not present

## 2023-03-28 DIAGNOSIS — Z6832 Body mass index (BMI) 32.0-32.9, adult: Secondary | ICD-10-CM | POA: Diagnosis not present

## 2023-03-28 DIAGNOSIS — R300 Dysuria: Secondary | ICD-10-CM | POA: Diagnosis not present

## 2023-03-28 DIAGNOSIS — E6609 Other obesity due to excess calories: Secondary | ICD-10-CM | POA: Diagnosis not present

## 2023-03-28 DIAGNOSIS — N390 Urinary tract infection, site not specified: Secondary | ICD-10-CM | POA: Diagnosis not present

## 2023-03-28 DIAGNOSIS — I10 Essential (primary) hypertension: Secondary | ICD-10-CM | POA: Diagnosis not present

## 2023-03-28 DIAGNOSIS — A599 Trichomoniasis, unspecified: Secondary | ICD-10-CM | POA: Diagnosis not present

## 2023-03-28 DIAGNOSIS — F419 Anxiety disorder, unspecified: Secondary | ICD-10-CM | POA: Diagnosis not present

## 2023-03-28 DIAGNOSIS — M545 Low back pain, unspecified: Secondary | ICD-10-CM | POA: Diagnosis not present

## 2023-03-28 DIAGNOSIS — R319 Hematuria, unspecified: Secondary | ICD-10-CM | POA: Diagnosis not present

## 2023-04-02 DIAGNOSIS — I10 Essential (primary) hypertension: Secondary | ICD-10-CM | POA: Diagnosis not present

## 2023-04-02 DIAGNOSIS — Z13220 Encounter for screening for lipoid disorders: Secondary | ICD-10-CM | POA: Diagnosis not present

## 2023-04-02 DIAGNOSIS — Z0000 Encounter for general adult medical examination without abnormal findings: Secondary | ICD-10-CM | POA: Diagnosis not present

## 2023-04-02 DIAGNOSIS — Z1159 Encounter for screening for other viral diseases: Secondary | ICD-10-CM | POA: Diagnosis not present

## 2023-04-02 DIAGNOSIS — Z6833 Body mass index (BMI) 33.0-33.9, adult: Secondary | ICD-10-CM | POA: Diagnosis not present

## 2023-04-02 DIAGNOSIS — E559 Vitamin D deficiency, unspecified: Secondary | ICD-10-CM | POA: Diagnosis not present

## 2023-06-02 ENCOUNTER — Ambulatory Visit: Payer: Medicaid Other | Admitting: Orthopaedic Surgery

## 2023-07-08 DIAGNOSIS — G8929 Other chronic pain: Secondary | ICD-10-CM | POA: Diagnosis not present

## 2023-07-08 DIAGNOSIS — M545 Low back pain, unspecified: Secondary | ICD-10-CM | POA: Diagnosis not present

## 2023-07-08 DIAGNOSIS — F411 Generalized anxiety disorder: Secondary | ICD-10-CM | POA: Diagnosis not present

## 2023-07-08 DIAGNOSIS — Z7689 Persons encountering health services in other specified circumstances: Secondary | ICD-10-CM | POA: Diagnosis not present

## 2023-07-08 DIAGNOSIS — R0602 Shortness of breath: Secondary | ICD-10-CM | POA: Diagnosis not present

## 2023-07-08 DIAGNOSIS — I10 Essential (primary) hypertension: Secondary | ICD-10-CM | POA: Diagnosis not present

## 2023-07-08 DIAGNOSIS — R062 Wheezing: Secondary | ICD-10-CM | POA: Diagnosis not present

## 2023-07-08 DIAGNOSIS — Z6837 Body mass index (BMI) 37.0-37.9, adult: Secondary | ICD-10-CM | POA: Diagnosis not present

## 2023-07-08 DIAGNOSIS — Z720 Tobacco use: Secondary | ICD-10-CM | POA: Diagnosis not present

## 2023-12-30 DIAGNOSIS — M545 Low back pain, unspecified: Secondary | ICD-10-CM | POA: Diagnosis not present

## 2023-12-30 DIAGNOSIS — I10 Essential (primary) hypertension: Secondary | ICD-10-CM | POA: Diagnosis not present

## 2023-12-30 DIAGNOSIS — G8929 Other chronic pain: Secondary | ICD-10-CM | POA: Diagnosis not present

## 2023-12-30 DIAGNOSIS — R0602 Shortness of breath: Secondary | ICD-10-CM | POA: Diagnosis not present

## 2023-12-30 DIAGNOSIS — M1990 Unspecified osteoarthritis, unspecified site: Secondary | ICD-10-CM | POA: Diagnosis not present

## 2023-12-30 DIAGNOSIS — F411 Generalized anxiety disorder: Secondary | ICD-10-CM | POA: Diagnosis not present

## 2023-12-30 DIAGNOSIS — Z7689 Persons encountering health services in other specified circumstances: Secondary | ICD-10-CM | POA: Diagnosis not present

## 2023-12-30 DIAGNOSIS — Z87891 Personal history of nicotine dependence: Secondary | ICD-10-CM | POA: Diagnosis not present

## 2023-12-30 DIAGNOSIS — Z1231 Encounter for screening mammogram for malignant neoplasm of breast: Secondary | ICD-10-CM | POA: Diagnosis not present

## 2024-04-16 DIAGNOSIS — S61217A Laceration without foreign body of left little finger without damage to nail, initial encounter: Secondary | ICD-10-CM | POA: Diagnosis not present

## 2024-04-27 DIAGNOSIS — S6440XA Injury of digital nerve of unspecified finger, initial encounter: Secondary | ICD-10-CM | POA: Diagnosis not present

## 2024-04-27 DIAGNOSIS — S66327A Laceration of extensor muscle, fascia and tendon of left little finger at wrist and hand level, initial encounter: Secondary | ICD-10-CM | POA: Diagnosis not present

## 2024-05-18 DIAGNOSIS — S6440XD Injury of digital nerve of unspecified finger, subsequent encounter: Secondary | ICD-10-CM | POA: Diagnosis not present

## 2024-05-18 DIAGNOSIS — S66327A Laceration of extensor muscle, fascia and tendon of left little finger at wrist and hand level, initial encounter: Secondary | ICD-10-CM | POA: Diagnosis not present
